# Patient Record
Sex: Female | Born: 1975 | Race: White | Hispanic: No | Marital: Married | Smoking: Never smoker
Health system: Southern US, Community
[De-identification: ages and names within clinical notes are randomized; demographics above are authoritative.]

## PROBLEM LIST (undated history)

## (undated) DIAGNOSIS — F419 Anxiety disorder, unspecified: Secondary | ICD-10-CM

## (undated) DIAGNOSIS — Z973 Presence of spectacles and contact lenses: Secondary | ICD-10-CM

## (undated) DIAGNOSIS — N840 Polyp of corpus uteri: Secondary | ICD-10-CM

## (undated) DIAGNOSIS — E785 Hyperlipidemia, unspecified: Secondary | ICD-10-CM

## (undated) DIAGNOSIS — K219 Gastro-esophageal reflux disease without esophagitis: Secondary | ICD-10-CM

## (undated) DIAGNOSIS — M1990 Unspecified osteoarthritis, unspecified site: Secondary | ICD-10-CM

## (undated) HISTORY — DX: Anxiety disorder, unspecified: F41.9

## (undated) HISTORY — DX: Gastro-esophageal reflux disease without esophagitis: K21.9

## (undated) HISTORY — PX: SHOULDER SURGERY: SHX246

## (undated) HISTORY — DX: Hyperlipidemia, unspecified: E78.5

---
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
## 2008-08-02 HISTORY — PX: OTHER SURGICAL HISTORY: SHX169

## 2010-08-02 HISTORY — PX: OTHER SURGICAL HISTORY: SHX169

## 2012-05-25 ENCOUNTER — Encounter: Payer: Self-pay | Admitting: Sports Medicine

## 2012-05-25 ENCOUNTER — Ambulatory Visit (INDEPENDENT_AMBULATORY_CARE_PROVIDER_SITE_OTHER): Payer: BC Managed Care – PPO | Admitting: Sports Medicine

## 2012-05-25 VITALS — BP 130/82 | HR 69 | Ht 63.5 in | Wt 120.0 lb

## 2012-05-25 DIAGNOSIS — M25561 Pain in right knee: Secondary | ICD-10-CM

## 2012-05-25 DIAGNOSIS — M25569 Pain in unspecified knee: Secondary | ICD-10-CM

## 2012-05-25 DIAGNOSIS — M25562 Pain in left knee: Secondary | ICD-10-CM

## 2012-05-25 NOTE — Assessment & Plan Note (Signed)
Ultrasound of right and left knee Quadriceps tendon is normal bilaterally Patellar tendon is normal bilaterally there is a very small spur at the left distal patellar pole Retropatellar sulcus is reviewed and a sunrise position and there is no abnormal spurring or loss of cartilage on either side Medial meniscus is normal bilaterally Lateral meniscus is normal bilaterally Suprapatellar pouch does not reveal any effusion  With relatively normal ultrasound and unremarkable exam she could consider returning to running. Her running gait is excellent.  I think we should do the following Resume her rehabilitation exercises for her hips and knees as prescribed physical therapy Use body helix patellar straps bilaterally She was placed in sports insoles today She can continue with elliptical and spin class Start doing some easy treadmill running If this goes okay and she was to get back into running I would suggest we put her in a custom orthotic to cushion the knees bilaterally  She will return to see me in 4-6 weeks if she decides to do this

## 2012-05-25 NOTE — Progress Notes (Signed)
Patient ID: Roberta Larson, female   DOB: Jul 12, 1976, 36 y.o.   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 She became a runner and very active individual throughout her pediatrics training and while she was living in [place]. Unfortunately she developed anterior knee pain. 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  She saw physical therapy. She stopped running. She has been doing elliptical and spin class without much knee pain. When she has tried to start running again she has had recurrence of knee pain.  Now over the last few weeks she said pain in the anterior portion of the left knee. She does not see any swelling locking or giving way. She would like to know if any issues before the left knee becomes more of a problem like the right.  She was put in rigid half length orthotics for shin splints when she was in middle school  Physical examination  No acute distress  Bialteral Knee: Normal to inspection with no erythema or effusion or obvious bony abnormalities. Palpation normal with no warmth or joint line tenderness No patellar tenderness or condyle tenderness. ROM normal in flexion and extension and lower leg rotation. Left knee shows about 3 hyperextension the right is at 0  Ligaments with solid consistent endpoints including ACL, PCL, LCL, MCL. Negative Mcmurray's and provocative meniscal tests.  Non painful patellar tracking but the  Patella does move laterally on both the right and left more so on the right  There is some mild crepitation at the upper outer corner of both patellas  Patellar and quadriceps tendons unremarkable. Hamstring and quadriceps strength is normal. She actually has excellent quadriceps development. Hip abduction strength is excellent  Feet show loss of longitudinal arch

## 2012-08-02 HISTORY — PX: ABDOMINOPLASTY: SUR9

## 2012-10-27 ENCOUNTER — Other Ambulatory Visit: Payer: Self-pay | Admitting: *Deleted

## 2012-10-27 DIAGNOSIS — Z1231 Encounter for screening mammogram for malignant neoplasm of breast: Secondary | ICD-10-CM

## 2012-11-15 ENCOUNTER — Ambulatory Visit
Admission: RE | Admit: 2012-11-15 | Discharge: 2012-11-15 | Disposition: A | Discharge status: Home / Self Care | Payer: BC Managed Care – PPO | Source: Ambulatory Visit | Attending: *Deleted | Admitting: *Deleted

## 2012-11-15 DIAGNOSIS — Z1231 Encounter for screening mammogram for malignant neoplasm of breast: Secondary | ICD-10-CM

## 2014-10-22 ENCOUNTER — Encounter (HOSPITAL_COMMUNITY): Payer: Self-pay | Admitting: Emergency Medicine

## 2014-10-22 ENCOUNTER — Emergency Department (HOSPITAL_COMMUNITY)
Admission: EM | Admit: 2014-10-22 | Discharge: 2014-10-22 | Disposition: A | Discharge status: Home / Self Care | Payer: BLUE CROSS/BLUE SHIELD | Source: Home / Self Care

## 2014-10-22 DIAGNOSIS — S61219A Laceration without foreign body of unspecified finger without damage to nail, initial encounter: Secondary | ICD-10-CM

## 2014-10-22 NOTE — ED Provider Notes (Signed)
Roberta Larson is a 39 y.o. female who presents to Urgent Care today for laceration. Patient suffered a laceration to her right third digit at the radial aspect today at home. She cut her finger while cutting vegetables with a sharp clean knife. She applied compression which control bleeding. Her last tetanus vaccine was approximately 4 years ago. She denies any weakness or numbness distally.   History reviewed. No pertinent past medical history. History reviewed. No pertinent past surgical history. History  Substance Use Topics  . Smoking status: Never Smoker   . Smokeless tobacco: Never Used  . Alcohol Use: Not on file   ROS as above Medications: No current facility-administered medications for this encounter.   Current Outpatient Prescriptions  Medication Sig Dispense Refill  . Cetirizine HCl (ZYRTEC ALLERGY) 10 MG CAPS Take 10 mg by mouth daily.    . Multiple Vitamins-Minerals (MULTIVITAMIN WITH MINERALS) tablet Take 1 tablet by mouth daily.    Marland Kitchen. NAPROXEN PO Take by mouth as needed.     No Known Allergies   Exam:  BP 125/83 mmHg  Pulse 64  Temp(Src) 98.9 F (37.2 C) (Oral)  Resp 18  SpO2 100% Gen: Well NAD Right third digit V-shaped shallow laceration at the radial aspect of the distal phalanx.  Motion capillary refill sensation are intact distally  Laceration repair:  Consent obtained and timeout performed. Tourniquet applied. Dermabond applied and allowed a harden Tourniquet removed. Total tourniquet time less than 5 minutes.  No results found for this or any previous visit (from the past 24 hour(s)). No results found.  Assessment and Plan: 39 y.o. female with distal laceration to the third finger. Repaired with Dermabond. Tetanus up-to-date.  Discussed warning signs or symptoms. Please see discharge instructions. Patient expresses understanding.     Rodolph BongEvan S Breydon Senters, MD 10/22/14 [phone]

## 2014-10-22 NOTE — ED Notes (Signed)
Cut finger tip while cooking this evening-right middle fingertip

## 2014-10-22 NOTE — Discharge Instructions (Signed)
Thank you for coming in today. ° °Laceration Care, Adult °A laceration is a cut or lesion that goes through all layers of the skin and into the tissue just beneath the skin. °TREATMENT  °Some lacerations may not require closure. Some lacerations may not be able to be closed due to an increased risk of infection. It is important to see your caregiver as soon as possible after an injury to minimize the risk of infection and maximize the opportunity for successful closure. °If closure is appropriate, pain medicines may be given, if needed. The wound will be cleaned to help prevent infection. Your caregiver will use stitches (sutures), staples, wound glue (adhesive), or skin adhesive strips to repair the laceration. These tools bring the skin edges together to allow for faster healing and a better cosmetic outcome. However, all wounds will heal with a scar. Once the wound has healed, scarring can be minimized by covering the wound with sunscreen during the day for 1 full year. °HOME CARE INSTRUCTIONS  °For sutures or staples: °· Keep the wound clean and dry. °· If you were given a bandage (dressing), you should change it at least once a day. Also, change the dressing if it becomes wet or dirty, or as directed by your caregiver. °· Wash the wound with soap and water 2 times a day. Rinse the wound off with water to remove all soap. Pat the wound dry with a clean towel. °· After cleaning, apply a thin layer of the antibiotic ointment as recommended by your caregiver. This will help prevent infection and keep the dressing from sticking. °· You may shower as usual after the first 24 hours. Do not soak the wound in water until the sutures are removed. °· Only take over-the-counter or prescription medicines for pain, discomfort, or fever as directed by your caregiver. °· Get your sutures or staples removed as directed by your caregiver. °For skin adhesive strips: °· Keep the wound clean and dry. °· Do not get the skin adhesive  strips wet. You may bathe carefully, using caution to keep the wound dry. °· If the wound gets wet, pat it dry with a clean towel. °· Skin adhesive strips will fall off on their own. You may trim the strips as the wound heals. Do not remove skin adhesive strips that are still stuck to the wound. They will fall off in time. °For wound adhesive: °· You may briefly wet your wound in the shower or bath. Do not soak or scrub the wound. Do not swim. Avoid periods of heavy perspiration until the skin adhesive has fallen off on its own. After showering or bathing, gently pat the wound dry with a clean towel. °· Do not apply liquid medicine, cream medicine, or ointment medicine to your wound while the skin adhesive is in place. This may loosen the film before your wound is healed. °· If a dressing is placed over the wound, be careful not to apply tape directly over the skin adhesive. This may cause the adhesive to be pulled off before the wound is healed. °· Avoid prolonged exposure to sunlight or tanning lamps while the skin adhesive is in place. Exposure to ultraviolet light in the first year will darken the scar. °· The skin adhesive will usually remain in place for 5 to 10 days, then naturally fall off the skin. Do not pick at the adhesive film. °You may need a tetanus shot if: °· You cannot remember when you had your last tetanus shot. °·   You have never had a tetanus shot. °If you get a tetanus shot, your arm may swell, get red, and feel warm to the touch. This is common and not a problem. If you need a tetanus shot and you choose not to have one, there is a rare chance of getting tetanus. Sickness from tetanus can be serious. °SEEK MEDICAL CARE IF:  °· You have redness, swelling, or increasing pain in the wound. °· You see a red line that goes away from the wound. °· You have yellowish-white fluid (pus) coming from the wound. °· You have a fever. °· You notice a bad smell coming from the wound or dressing. °· Your  wound breaks open before or after sutures have been removed. °· You notice something coming out of the wound such as wood or glass. °· Your wound is on your hand or foot and you cannot move a finger or toe. °SEEK IMMEDIATE MEDICAL CARE IF:  °· Your pain is not controlled with prescribed medicine. °· You have severe swelling around the wound causing pain and numbness or a change in color in your arm, hand, leg, or foot. °· Your wound splits open and starts bleeding. °· You have worsening numbness, weakness, or loss of function of any joint around or beyond the wound. °· You develop painful lumps near the wound or on the skin anywhere on your body. °MAKE SURE YOU:  °· Understand these instructions. °· Will watch your condition. °· Will get help right away if you are not doing well or get worse. °Document Released: 07/19/2005 Document Revised: 10/11/2011 Document Reviewed: 01/12/2011 °ExitCare® Patient Information ©2015 [employer]. This information is not intended to replace advice given to you by your health care provider. Make sure you discuss any questions you have with your health care provider. ° °

## 2015-07-16 ENCOUNTER — Other Ambulatory Visit: Payer: Self-pay | Admitting: Orthopedic Surgery

## 2015-07-16 DIAGNOSIS — M79652 Pain in left thigh: Secondary | ICD-10-CM

## 2015-07-16 DIAGNOSIS — M25552 Pain in left hip: Secondary | ICD-10-CM

## 2015-08-01 ENCOUNTER — Ambulatory Visit
Admission: RE | Admit: 2015-08-01 | Discharge: 2015-08-01 | Disposition: A | Discharge status: Home / Self Care | Payer: 59 | Source: Ambulatory Visit | Attending: Orthopedic Surgery | Admitting: Orthopedic Surgery

## 2015-08-01 DIAGNOSIS — M25552 Pain in left hip: Secondary | ICD-10-CM

## 2015-08-01 DIAGNOSIS — M79652 Pain in left thigh: Secondary | ICD-10-CM

## 2015-08-01 MED ORDER — IOHEXOL 180 MG/ML  SOLN
15.0000 mL | Freq: Once | INTRAMUSCULAR | Status: AC | PRN
Start: 2015-08-01 — End: 2015-08-01
  Administered 2015-08-01: 15 mL via INTRA_ARTICULAR

## 2015-09-03 HISTORY — PX: HIP ARTHROSCOPY W/ LABRAL REPAIR: SHX1750

## 2016-09-30 ENCOUNTER — Encounter (HOSPITAL_BASED_OUTPATIENT_CLINIC_OR_DEPARTMENT_OTHER): Payer: Self-pay | Admitting: *Deleted

## 2016-09-30 NOTE — Progress Notes (Signed)
NPO AFTER MN W/ EXCEPTION CLEAR LIQUIDS UNTIL 0700 ( NO CREAM/  MILK PRODUCTS).  ARRIVE AT 1130. NEEDS URINE PREG.  GETTING LAB WORK DONE Wednesday, 10-06-2016 (CBC, BMET).

## 2016-10-05 NOTE — H&P (Addendum)
Roberta MarseilleCarey Larson is an 41 y.o. female with intermenstrual bleeding.  Pelvic ultrasound showed possibly 3 endometrial polyps.  Pertinent Gynecological History: Menses: flow is moderate Bleeding: intermenstrual bleeding Contraception: vasectomy DES exposure: unknown Blood transfusions: none Sexually transmitted diseases: no past history Previous GYN Procedures: C/S, ex lap with RSO  Last mammogram: n/a Date: n/a Last pap: normal Date: 08/2016 OB History: G2, P2   Menstrual History: Menarche age: n/a Patient's last menstrual period was 09/03/2016 (exact date).    Past Medical History:  Diagnosis Date  . Endometrial polyp   . OA (osteoarthritis)    RIGHT KNEE,  LEFT HIP  . Wears contact lenses     Past Surgical History:  Procedure Laterality Date  . ABDOMINOPLASTY  2014  . BREAST ENHANCEMENT SURGERY  1997  . CESAREAN SECTION  2008  . EXCISION SUBCUTANEOUS CYST  2012  . EXPLORATORY LAPARTOMY W/ RIGHT SALPINGOOPHORECTOMY  2010  . HIP ARTHROSCOPY W/ LABRAL REPAIR Left 09/2015    History reviewed. No pertinent family history.  Social History:  reports that she has never smoked. She has never used smokeless tobacco. She reports that she drinks alcohol. She reports that she does not use drugs.  Allergies: No Known Allergies  No prescriptions prior to admission.    ROS  Height 5' 3.5" (1.613 m), weight 54.4 kg (120 lb), last menstrual period 09/03/2016. Physical Exam  Constitutional: She is oriented to person, place, and time. She appears well-developed and well-nourished.  GI: Soft. There is no rebound and no guarding.  Neurological: She is alert and oriented to person, place, and time.  Skin: Skin is warm and dry.  Psychiatric: She has a normal mood and affect. Her behavior is normal.    No results found for this or any previous visit (from the past 24 hour(s)).  No results found.  Assessment/Plan: 40yo with endometrial polyps -H/S, D&C, Myosure -Patient has been  counseled re: risk of bleeding, infection, scarring and damage to surrounding structures.  All questions were answered and the patient wishes to proceed.  Roma Bondar 10/05/2016, 8:58 AM

## 2016-10-06 DIAGNOSIS — N926 Irregular menstruation, unspecified: Secondary | ICD-10-CM | POA: Diagnosis present

## 2016-10-06 DIAGNOSIS — M1711 Unilateral primary osteoarthritis, right knee: Secondary | ICD-10-CM | POA: Diagnosis not present

## 2016-10-06 DIAGNOSIS — Z96642 Presence of left artificial hip joint: Secondary | ICD-10-CM | POA: Diagnosis not present

## 2016-10-06 DIAGNOSIS — R938 Abnormal findings on diagnostic imaging of other specified body structures: Secondary | ICD-10-CM | POA: Diagnosis present

## 2016-10-06 DIAGNOSIS — N8500 Endometrial hyperplasia, unspecified: Secondary | ICD-10-CM | POA: Diagnosis not present

## 2016-10-06 DIAGNOSIS — Z9889 Other specified postprocedural states: Secondary | ICD-10-CM | POA: Diagnosis not present

## 2016-10-06 DIAGNOSIS — Z90721 Acquired absence of ovaries, unilateral: Secondary | ICD-10-CM | POA: Diagnosis not present

## 2016-10-06 LAB — CBC
HCT: 39.6 % (ref 36.0–46.0)
Hemoglobin: 13.2 g/dL (ref 12.0–15.0)
MCH: 30.8 pg (ref 26.0–34.0)
MCHC: 33.3 g/dL (ref 30.0–36.0)
MCV: 92.3 fL (ref 78.0–100.0)
PLATELETS: 175 10*3/uL (ref 150–400)
RBC: 4.29 MIL/uL (ref 3.87–5.11)
RDW: 12.9 % (ref 11.5–15.5)
WBC: 3.3 10*3/uL — AB (ref 4.0–10.5)

## 2016-10-06 LAB — BASIC METABOLIC PANEL
ANION GAP: 6 (ref 5–15)
BUN: 18 mg/dL (ref 6–20)
CO2: 29 mmol/L (ref 22–32)
Calcium: 9.6 mg/dL (ref 8.9–10.3)
Chloride: 103 mmol/L (ref 101–111)
Creatinine, Ser: 0.74 mg/dL (ref 0.44–1.00)
Glucose, Bld: 91 mg/dL (ref 65–99)
Potassium: 4.2 mmol/L (ref 3.5–5.1)
Sodium: 138 mmol/L (ref 135–145)

## 2016-10-07 ENCOUNTER — Encounter (HOSPITAL_BASED_OUTPATIENT_CLINIC_OR_DEPARTMENT_OTHER)
Admission: RE | Disposition: A | Discharge status: Home / Self Care | Payer: Self-pay | Source: Ambulatory Visit | Attending: Obstetrics & Gynecology

## 2016-10-07 ENCOUNTER — Ambulatory Visit (HOSPITAL_BASED_OUTPATIENT_CLINIC_OR_DEPARTMENT_OTHER): Payer: 59 | Admitting: Anesthesiology

## 2016-10-07 ENCOUNTER — Encounter (HOSPITAL_BASED_OUTPATIENT_CLINIC_OR_DEPARTMENT_OTHER): Payer: Self-pay | Admitting: Anesthesiology

## 2016-10-07 ENCOUNTER — Ambulatory Visit (HOSPITAL_BASED_OUTPATIENT_CLINIC_OR_DEPARTMENT_OTHER)
Admission: RE | Admit: 2016-10-07 | Discharge: 2016-10-07 | Disposition: A | Discharge status: Home / Self Care | Payer: 59 | Source: Ambulatory Visit | Attending: Obstetrics & Gynecology | Admitting: Obstetrics & Gynecology

## 2016-10-07 DIAGNOSIS — N8500 Endometrial hyperplasia, unspecified: Secondary | ICD-10-CM | POA: Diagnosis not present

## 2016-10-07 DIAGNOSIS — Z90721 Acquired absence of ovaries, unilateral: Secondary | ICD-10-CM | POA: Insufficient documentation

## 2016-10-07 DIAGNOSIS — N840 Polyp of corpus uteri: Secondary | ICD-10-CM

## 2016-10-07 DIAGNOSIS — M1711 Unilateral primary osteoarthritis, right knee: Secondary | ICD-10-CM | POA: Insufficient documentation

## 2016-10-07 DIAGNOSIS — Z9889 Other specified postprocedural states: Secondary | ICD-10-CM | POA: Insufficient documentation

## 2016-10-07 DIAGNOSIS — Z96642 Presence of left artificial hip joint: Secondary | ICD-10-CM | POA: Insufficient documentation

## 2016-10-07 DIAGNOSIS — N926 Irregular menstruation, unspecified: Secondary | ICD-10-CM | POA: Insufficient documentation

## 2016-10-07 DIAGNOSIS — R938 Abnormal findings on diagnostic imaging of other specified body structures: Secondary | ICD-10-CM | POA: Insufficient documentation

## 2016-10-07 HISTORY — PX: DILATATION & CURETTAGE/HYSTEROSCOPY WITH MYOSURE: SHX6511

## 2016-10-07 HISTORY — DX: Polyp of corpus uteri: N84.0

## 2016-10-07 HISTORY — DX: Presence of spectacles and contact lenses: Z97.3

## 2016-10-07 HISTORY — DX: Unspecified osteoarthritis, unspecified site: M19.90

## 2016-10-07 LAB — POCT PREGNANCY, URINE: Preg Test, Ur: NEGATIVE

## 2016-10-07 SURGERY — DILATATION & CURETTAGE/HYSTEROSCOPY WITH MYOSURE
Anesthesia: General | Site: Uterus

## 2016-10-07 MED ORDER — ONDANSETRON HCL 4 MG/2ML IJ SOLN
INTRAMUSCULAR | Status: AC
  Filled 2016-10-07: qty 2

## 2016-10-07 MED ORDER — FENTANYL CITRATE (PF) 100 MCG/2ML IJ SOLN
INTRAMUSCULAR | Status: AC
  Filled 2016-10-07: qty 2

## 2016-10-07 MED ORDER — KETOROLAC TROMETHAMINE 30 MG/ML IJ SOLN
INTRAMUSCULAR | Status: AC
  Filled 2016-10-07: qty 1

## 2016-10-07 MED ORDER — FENTANYL CITRATE (PF) 100 MCG/2ML IJ SOLN
INTRAMUSCULAR | Status: DC | PRN
  Administered 2016-10-07 (×2): 50 ug via INTRAVENOUS

## 2016-10-07 MED ORDER — CEFAZOLIN SODIUM-DEXTROSE 2-4 GM/100ML-% IV SOLN
2.0000 g | INTRAVENOUS | Status: AC
  Administered 2016-10-07: 2 g via INTRAVENOUS
  Filled 2016-10-07: qty 100

## 2016-10-07 MED ORDER — ONDANSETRON HCL 4 MG/2ML IJ SOLN
INTRAMUSCULAR | Status: DC | PRN
  Administered 2016-10-07: 4 mg via INTRAVENOUS

## 2016-10-07 MED ORDER — LIDOCAINE HCL 1 % IJ SOLN
INTRAMUSCULAR | Status: DC | PRN
  Administered 2016-10-07: 10 mL

## 2016-10-07 MED ORDER — EPHEDRINE 5 MG/ML INJ
INTRAVENOUS | Status: AC
  Filled 2016-10-07: qty 10

## 2016-10-07 MED ORDER — DEXAMETHASONE SODIUM PHOSPHATE 4 MG/ML IJ SOLN
INTRAMUSCULAR | Status: DC | PRN
  Administered 2016-10-07: 10 mg via INTRAVENOUS

## 2016-10-07 MED ORDER — LACTATED RINGERS IV SOLN
INTRAVENOUS | Status: DC
  Administered 2016-10-07: 12:00:00 via INTRAVENOUS
  Filled 2016-10-07: qty 1000

## 2016-10-07 MED ORDER — LACTATED RINGERS IV SOLN
INTRAVENOUS | Status: DC
  Administered 2016-10-07 (×2): via INTRAVENOUS
  Filled 2016-10-07: qty 1000

## 2016-10-07 MED ORDER — PROPOFOL 10 MG/ML IV BOLUS
INTRAVENOUS | Status: AC
  Filled 2016-10-07: qty 20

## 2016-10-07 MED ORDER — IBUPROFEN 600 MG PO TABS: 600.0000 mg | ORAL_TABLET | Freq: Four times a day (QID) | ORAL | 0 refills | Status: DC | PRN

## 2016-10-07 MED ORDER — LIDOCAINE 2% (20 MG/ML) 5 ML SYRINGE
INTRAMUSCULAR | Status: AC
  Filled 2016-10-07: qty 5

## 2016-10-07 MED ORDER — MIDAZOLAM HCL 2 MG/2ML IJ SOLN
INTRAMUSCULAR | Status: AC
  Filled 2016-10-07: qty 2

## 2016-10-07 MED ORDER — HYDROMORPHONE HCL 1 MG/ML IJ SOLN
0.2500 mg | INTRAMUSCULAR | Status: DC | PRN
  Filled 2016-10-07: qty 0.5

## 2016-10-07 MED ORDER — CEFAZOLIN SODIUM-DEXTROSE 2-4 GM/100ML-% IV SOLN
INTRAVENOUS | Status: AC
  Filled 2016-10-07: qty 100

## 2016-10-07 MED ORDER — KETOROLAC TROMETHAMINE 30 MG/ML IJ SOLN
INTRAMUSCULAR | Status: DC | PRN
  Administered 2016-10-07: 30 mg via INTRAVENOUS

## 2016-10-07 MED ORDER — SODIUM CHLORIDE 0.9 % IR SOLN
Status: DC | PRN
  Administered 2016-10-07: 1

## 2016-10-07 MED ORDER — LIDOCAINE 2% (20 MG/ML) 5 ML SYRINGE
INTRAMUSCULAR | Status: DC | PRN
  Administered 2016-10-07: 80 mg via INTRAVENOUS

## 2016-10-07 MED ORDER — EPHEDRINE SULFATE-NACL 50-0.9 MG/10ML-% IV SOSY
PREFILLED_SYRINGE | INTRAVENOUS | Status: DC | PRN
  Administered 2016-10-07: 10 mg via INTRAVENOUS

## 2016-10-07 MED ORDER — OXYCODONE-ACETAMINOPHEN 5-325 MG PO TABS: 1.0000 | ORAL_TABLET | Freq: Four times a day (QID) | ORAL | 0 refills | Status: DC | PRN

## 2016-10-07 MED ORDER — DEXAMETHASONE SODIUM PHOSPHATE 10 MG/ML IJ SOLN
INTRAMUSCULAR | Status: AC
  Filled 2016-10-07: qty 1

## 2016-10-07 MED ORDER — MIDAZOLAM HCL 5 MG/5ML IJ SOLN
INTRAMUSCULAR | Status: DC | PRN
  Administered 2016-10-07: 2 mg via INTRAVENOUS

## 2016-10-07 MED ORDER — PROPOFOL 10 MG/ML IV BOLUS
INTRAVENOUS | Status: DC | PRN
  Administered 2016-10-07: 180 mg via INTRAVENOUS

## 2016-10-07 SURGICAL SUPPLY — 32 items
BIPOLAR CUTTING LOOP 21FR (ELECTRODE)
CANISTER SUCT 3000ML PPV (MISCELLANEOUS) ×3 IMPLANT
CATH ROBINSON RED A/P 16FR (CATHETERS) ×3 IMPLANT
COVER BACK TABLE 60X90IN (DRAPES) ×6 IMPLANT
DEVICE MYOSURE LITE (MISCELLANEOUS) IMPLANT
DEVICE MYOSURE REACH (MISCELLANEOUS) IMPLANT
DILATOR CANAL MILEX (MISCELLANEOUS) IMPLANT
DRAPE LG THREE QUARTER DISP (DRAPES) ×3 IMPLANT
DRSG TELFA 3X8 NADH (GAUZE/BANDAGES/DRESSINGS) ×3 IMPLANT
ELECT REM PT RETURN 9FT ADLT (ELECTROSURGICAL)
ELECTRODE REM PT RTRN 9FT ADLT (ELECTROSURGICAL) IMPLANT
FILTER ARTHROSCOPY CONVERTOR (FILTER) ×3 IMPLANT
GLOVE BIO SURGEON STRL SZ 6 (GLOVE) ×6 IMPLANT
GLOVE BIOGEL PI IND STRL 6 (GLOVE) ×1 IMPLANT
GLOVE BIOGEL PI INDICATOR 6 (GLOVE) ×2
GLOVE SURG SS PI 6.0 STRL IVOR (GLOVE) ×6 IMPLANT
GOWN STRL REUS W/ TWL LRG LVL3 (GOWN DISPOSABLE) ×1 IMPLANT
GOWN STRL REUS W/TWL LRG LVL3 (GOWN DISPOSABLE) ×2
KIT RM TURNOVER CYSTO AR (KITS) ×3 IMPLANT
LEGGING LITHOTOMY PAIR STRL (DRAPES) ×3 IMPLANT
LOOP CUTTING BIPOLAR 21FR (ELECTRODE) IMPLANT
MYOSURE XL FIBROID REM (MISCELLANEOUS)
NEEDLE SPNL 22GX3.5 QUINCKE BK (NEEDLE) IMPLANT
PAD OB MATERNITY 4.3X12.25 (PERSONAL CARE ITEMS) ×3 IMPLANT
SEAL ROD LENS SCOPE MYOSURE (ABLATOR) ×3 IMPLANT
SYR CONTROL 10ML LL (SYRINGE) IMPLANT
SYSTEM TISS REMOVAL MYSR XL RM (MISCELLANEOUS) IMPLANT
TOWEL OR 17X24 6PK STRL BLUE (TOWEL DISPOSABLE) ×6 IMPLANT
TRAY DSU PREP LF (CUSTOM PROCEDURE TRAY) ×3 IMPLANT
TUBING AQUILEX INFLOW (TUBING) ×3 IMPLANT
TUBING AQUILEX OUTFLOW (TUBING) ×3 IMPLANT
WATER STERILE IRR 500ML POUR (IV SOLUTION) ×3 IMPLANT

## 2016-10-07 NOTE — Transfer of Care (Signed)
Last Vitals:  Vitals:   10/07/16 1129 10/07/16 1409  BP: 125/78 127/80  Pulse: 68 78  Resp: 16 12  Temp: 37 C 36.9 C    Last Pain:  Vitals:   10/07/16 1129  TempSrc: Oral      Patients Stated Pain Goal: 7 (10/07/16 1149)  Immediate Anesthesia Transfer of Care Note  Patient: Roberta Larson  Procedure(s) Performed: Procedure(s) (LRB): DILATATION & CURETTAGE/HYSTEROSCOPY (N/A)  Patient Location: PACU  Anesthesia Type: General  Level of Consciousness: awake, alert  and oriented  Airway & Oxygen Therapy: Patient Spontanous Breathing and Patient connected to nasal cannula oxygen  Post-op Assessment: Report given to PACU RN and Post -op Vital signs reviewed and stable  Post vital signs: Reviewed and stable  Complications: No apparent anesthesia complications

## 2016-10-07 NOTE — Op Note (Signed)
PREOPERATIVE DIAGNOSIS:  41 y.o. with irregular uterine bleeding  POSTOPERATIVE DIAGNOSIS: The same  PROCEDURE: Hysteroscopy, Dilation and Curettage.  SURGEON:  Dr. Linda Hedges  INDICATIONS: 41 y.o. here for scheduled surgery for heavy, irregular menstrual bleeding.   Risks of surgery were discussed with the patient including but not limited to: bleeding which may require transfusion; infection which may require antibiotics; injury to uterus or surrounding organs; intrauterine scarring which may impair future fertility; need for additional procedures including laparotomy or laparoscopy; and other postoperative/anesthesia complications. Written informed consent was obtained.    FINDINGS:  A 8 week size, retroflexed uterus.  Diffuse proliferative endometrium.  Normal ostia bilaterally.  ANESTHESIA:  MAC, paracervical block  ESTIMATED BLOOD LOSS:  Less than 20 ml  SPECIMENS: Endometrial curettings sent to pathology  COMPLICATIONS:  None immediate.  PROCEDURE DETAILS:  The patient received intravenous antibiotics while in the preoperative area.  She was then taken to the operating room where MAC was administered and was found to be adequate.  After an adequate timeout was performed, she was placed in the dorsal lithotomy position and examined; then prepped and draped in the sterile manner.   Her bladder was catheterized for an unmeasured amount of clear, yellow urine. A speculum was then placed in the patient's vagina and a single tooth tenaculum was applied to the anterior lip of the cervix.   A paracervical block using 10 ml of 1% Nesacaine was administered.  The uteus was sounded to 8.5 cm and dilated manually with metal dilators to accommodate the Myosure hysteroscope.  Once the cervix was dilated, the hysteroscope was inserted under direct visualization using saline as a suspension medium.  The uterine cavity was carefully examined, both ostia were recognized.   After further careful  visualization of the uterine cavity, the hysteroscope was removed under direct visualization.  A sharp curettage was then performed to obtain a large amount of endometrial curettings.  The tenaculum was removed from the anterior lip of the cervix and the vaginal speculum was removed after noting good hemostasis.  The patient tolerated the procedure well and was taken to the recovery area awake, extubated and in stable condition.

## 2016-10-07 NOTE — Discharge Instructions (Signed)
°  Post Anesthesia Home Care Instructions  Activity: Get plenty of rest for the remainder of the day. A responsible adult should stay with you for 24 hours following the procedure.  For the next 24 hours, DO NOT: -Drive a car -Advertising copywriterperate machinery -Drink alcoholic beverages -Take any medication unless instructed by your physician -Make any legal decisions or sign important papers.  Meals: Start with liquid foods such as gelatin or soup. Progress to regular foods as tolerated. Avoid greasy, spicy, heavy foods. If nausea and/or vomiting occur, drink only clear liquids until the nausea and/or vomiting subsides. Call your physician if vomiting continues.  Special Instructions/Symptoms: Your throat may feel dry or sore from the anesthesia or the breathing tube placed in your throat during surgery. If this causes discomfort, gargle with warm salt water. The discomfort should disappear within 24 hours.  If you had a scopolamine patch placed behind your ear for the management of post- operative nausea and/or vomiting:  1. The medication in the patch is effective for 72 hours, after which it should be removed.  Wrap patch in a tissue and discard in the trash. Wash hands thoroughly with soap and water. 2. You may remove the patch earlier than 72 hours if you experience unpleasant side effects which may include dry mouth, dizziness or visual disturbances. 3. Avoid touching the patch. Wash your hands with soap and water after contact with the patch.     D & C Home care Instructions:   Personal hygiene:  Used sanitary napkins for vaginal drainage not tampons. Shower or tub bathe the day after your procedure. No douching until bleeding stops. Always wipe from front to back after  Elimination.  Activity: Do not drive or operate any equipment today. The effects of the anesthesia are still present and drowsiness may result. Rest today, not necessarily flat bed rest, just take it easy. You may resume your  normal activity in one to 2 days.  Diet: Eat a light diet as desired this evening. You may resume a regular diet tomorrow.  Return to work: One to 2 days.  General Expectations of your surgery: Vaginal bleeding should be no heavier than a normal period. Spotting may continue up to 10 days. Mild cramps may continue for a couple of days. You may have a regular period in 2-6 weeks.  Unexpected observations call your doctor if these occur: persistent or heavy bleeding. Severe abdominal cramping or pain. Elevation of temperature greater than 100F.    Patient's Signature_______________________________________________________  Nurse's Signature________________________________________________________Call MD for T>100.4, heavy vaginal bleeding, severe abdominal pain, intractable nausea and/or vomiting, or respiratory distress.  Call office to schedule postop appointment in 2 weeks.  No driving while taking narcotics.  Pelvic rest x 2 weeks.

## 2016-10-07 NOTE — Anesthesia Procedure Notes (Addendum)
Procedure Name: LMA Insertion Date/Time: 10/07/2016 1:22 PM Performed by: Dorris SinghGREEN, CHARLENE Pre-anesthesia Checklist: Patient identified, Emergency Drugs available, Suction available and Patient being monitored Patient Re-evaluated:Patient Re-evaluated prior to inductionOxygen Delivery Method: Circle system utilized Preoxygenation: Pre-oxygenation with 100% oxygen Intubation Type: IV induction Ventilation: Mask ventilation without difficulty LMA: LMA inserted LMA Size: 4.0 Number of attempts: 1 Airway Equipment and Method: Bite block Placement Confirmation: positive ETCO2 Tube secured with: Tape Dental Injury: Teeth and Oropharynx as per pre-operative assessment

## 2016-10-07 NOTE — Anesthesia Postprocedure Evaluation (Signed)
Anesthesia Post Note  Patient: Roberta Larson  Procedure(s) Performed: Procedure(s) (LRB): DILATATION & CURETTAGE/HYSTEROSCOPY (N/A)  Patient location during evaluation: PACU Anesthesia Type: General Level of consciousness: awake Pain management: pain level controlled Vital Signs Assessment: post-procedure vital signs reviewed and stable Respiratory status: spontaneous breathing Cardiovascular status: stable Anesthetic complications: no       Last Vitals:  Vitals:   10/07/16 1409 10/07/16 1415  BP: 127/80 119/89  Pulse: 78 71  Resp: 12 (!) 22  Temp: 36.9 C     Last Pain:  Vitals:   10/07/16 1129  TempSrc: Oral                 Kadeja Granada

## 2016-10-07 NOTE — Progress Notes (Signed)
No change to H&P.  Informed consent signed.   Mitchel HonourMegan Madalene Mickler, DO

## 2016-10-07 NOTE — Anesthesia Preprocedure Evaluation (Addendum)
Anesthesia Evaluation  Patient identified by MRN, date of birth, ID band Patient awake    Reviewed: Allergy & Precautions, NPO status , Patient's Chart, lab work & pertinent test results  Airway Mallampati: II  TM Distance: >3 FB     Dental   Pulmonary neg pulmonary ROS,    breath sounds clear to auscultation       Cardiovascular negative cardio ROS   Rhythm:Regular Rate:Normal     Neuro/Psych    GI/Hepatic negative GI ROS, Neg liver ROS,   Endo/Other  negative endocrine ROS  Renal/GU negative Renal ROS     Musculoskeletal  (+) Arthritis ,   Abdominal   Peds  Hematology   Anesthesia Other Findings   Reproductive/Obstetrics                             Anesthesia Physical Anesthesia Plan  ASA: II  Anesthesia Plan: General   Post-op Pain Management:    Induction: Intravenous  Airway Management Planned: LMA  Additional Equipment:   Intra-op Plan:   Post-operative Plan: Extubation in OR  Informed Consent: I have reviewed the patients History and Physical, chart, labs and discussed the procedure including the risks, benefits and alternatives for the proposed anesthesia with the patient or authorized representative who has indicated his/her understanding and acceptance.   Dental advisory given  Plan Discussed with: CRNA and Anesthesiologist  Anesthesia Plan Comments:         Anesthesia Quick Evaluation

## 2016-10-08 ENCOUNTER — Encounter (HOSPITAL_BASED_OUTPATIENT_CLINIC_OR_DEPARTMENT_OTHER): Payer: Self-pay | Admitting: Obstetrics & Gynecology

## 2018-04-06 DIAGNOSIS — Z566 Other physical and mental strain related to work: Secondary | ICD-10-CM | POA: Diagnosis not present

## 2018-04-06 DIAGNOSIS — F419 Anxiety disorder, unspecified: Secondary | ICD-10-CM | POA: Diagnosis not present

## 2018-04-07 DIAGNOSIS — Z9889 Other specified postprocedural states: Secondary | ICD-10-CM | POA: Diagnosis not present

## 2018-04-07 DIAGNOSIS — G8929 Other chronic pain: Secondary | ICD-10-CM | POA: Diagnosis not present

## 2018-04-07 DIAGNOSIS — M25551 Pain in right hip: Secondary | ICD-10-CM | POA: Diagnosis not present

## 2018-04-11 ENCOUNTER — Other Ambulatory Visit: Payer: Self-pay | Admitting: Orthopedic Surgery

## 2018-04-11 DIAGNOSIS — M25551 Pain in right hip: Secondary | ICD-10-CM

## 2018-04-13 DIAGNOSIS — F419 Anxiety disorder, unspecified: Secondary | ICD-10-CM | POA: Diagnosis not present

## 2018-04-13 DIAGNOSIS — Z566 Other physical and mental strain related to work: Secondary | ICD-10-CM | POA: Diagnosis not present

## 2018-04-18 DIAGNOSIS — F419 Anxiety disorder, unspecified: Secondary | ICD-10-CM | POA: Diagnosis not present

## 2018-04-18 DIAGNOSIS — Z566 Other physical and mental strain related to work: Secondary | ICD-10-CM | POA: Diagnosis not present

## 2018-05-08 ENCOUNTER — Ambulatory Visit
Admission: RE | Admit: 2018-05-08 | Discharge: 2018-05-08 | Disposition: A | Discharge status: Home / Self Care | Payer: BLUE CROSS/BLUE SHIELD | Source: Ambulatory Visit | Attending: Orthopedic Surgery | Admitting: Orthopedic Surgery

## 2018-05-08 ENCOUNTER — Ambulatory Visit
Admission: RE | Admit: 2018-05-08 | Discharge: 2018-05-08 | Disposition: A | Discharge status: Home / Self Care | Payer: 59 | Source: Ambulatory Visit | Attending: Orthopedic Surgery | Admitting: Orthopedic Surgery

## 2018-05-08 DIAGNOSIS — Z566 Other physical and mental strain related to work: Secondary | ICD-10-CM | POA: Diagnosis not present

## 2018-05-08 DIAGNOSIS — M25551 Pain in right hip: Secondary | ICD-10-CM | POA: Diagnosis not present

## 2018-05-08 DIAGNOSIS — F419 Anxiety disorder, unspecified: Secondary | ICD-10-CM | POA: Diagnosis not present

## 2018-05-08 DIAGNOSIS — S43431A Superior glenoid labrum lesion of right shoulder, initial encounter: Secondary | ICD-10-CM | POA: Diagnosis not present

## 2018-05-08 MED ORDER — IOPAMIDOL (ISOVUE-M 200) INJECTION 41%
15.0000 mL | Freq: Once | INTRAMUSCULAR | Status: AC
  Administered 2018-05-08: 15 mL via INTRA_ARTICULAR

## 2018-05-18 DIAGNOSIS — F419 Anxiety disorder, unspecified: Secondary | ICD-10-CM | POA: Diagnosis not present

## 2018-05-18 DIAGNOSIS — Z566 Other physical and mental strain related to work: Secondary | ICD-10-CM | POA: Diagnosis not present

## 2018-05-25 DIAGNOSIS — F419 Anxiety disorder, unspecified: Secondary | ICD-10-CM | POA: Diagnosis not present

## 2018-05-25 DIAGNOSIS — Z566 Other physical and mental strain related to work: Secondary | ICD-10-CM | POA: Diagnosis not present

## 2018-05-30 DIAGNOSIS — Z9889 Other specified postprocedural states: Secondary | ICD-10-CM | POA: Diagnosis not present

## 2018-05-30 DIAGNOSIS — M25551 Pain in right hip: Secondary | ICD-10-CM | POA: Diagnosis not present

## 2018-06-01 DIAGNOSIS — L299 Pruritus, unspecified: Secondary | ICD-10-CM | POA: Diagnosis not present

## 2018-06-01 DIAGNOSIS — L739 Follicular disorder, unspecified: Secondary | ICD-10-CM | POA: Diagnosis not present

## 2018-06-05 DIAGNOSIS — F419 Anxiety disorder, unspecified: Secondary | ICD-10-CM | POA: Diagnosis not present

## 2018-06-05 DIAGNOSIS — Z566 Other physical and mental strain related to work: Secondary | ICD-10-CM | POA: Diagnosis not present

## 2018-06-22 DIAGNOSIS — Z566 Other physical and mental strain related to work: Secondary | ICD-10-CM | POA: Diagnosis not present

## 2018-06-22 DIAGNOSIS — F419 Anxiety disorder, unspecified: Secondary | ICD-10-CM | POA: Diagnosis not present

## 2018-06-23 DIAGNOSIS — E7800 Pure hypercholesterolemia, unspecified: Secondary | ICD-10-CM | POA: Diagnosis not present

## 2018-06-23 DIAGNOSIS — M169 Osteoarthritis of hip, unspecified: Secondary | ICD-10-CM | POA: Diagnosis not present

## 2018-06-23 DIAGNOSIS — Z01818 Encounter for other preprocedural examination: Secondary | ICD-10-CM | POA: Diagnosis not present

## 2018-06-27 DIAGNOSIS — R29898 Other symptoms and signs involving the musculoskeletal system: Secondary | ICD-10-CM | POA: Diagnosis not present

## 2018-06-27 DIAGNOSIS — M25651 Stiffness of right hip, not elsewhere classified: Secondary | ICD-10-CM | POA: Diagnosis not present

## 2018-06-27 DIAGNOSIS — M25551 Pain in right hip: Secondary | ICD-10-CM | POA: Diagnosis not present

## 2018-06-27 DIAGNOSIS — R262 Difficulty in walking, not elsewhere classified: Secondary | ICD-10-CM | POA: Diagnosis not present

## 2018-06-28 DIAGNOSIS — M25851 Other specified joint disorders, right hip: Secondary | ICD-10-CM | POA: Diagnosis not present

## 2018-06-28 DIAGNOSIS — M6588 Other synovitis and tenosynovitis, other site: Secondary | ICD-10-CM | POA: Diagnosis not present

## 2018-06-28 DIAGNOSIS — M76891 Other specified enthesopathies of right lower limb, excluding foot: Secondary | ICD-10-CM | POA: Diagnosis not present

## 2018-06-28 DIAGNOSIS — M25551 Pain in right hip: Secondary | ICD-10-CM | POA: Diagnosis not present

## 2018-06-28 DIAGNOSIS — S73191A Other sprain of right hip, initial encounter: Secondary | ICD-10-CM | POA: Diagnosis not present

## 2018-06-28 DIAGNOSIS — M94251 Chondromalacia, right hip: Secondary | ICD-10-CM | POA: Diagnosis not present

## 2018-06-28 DIAGNOSIS — M7060 Trochanteric bursitis, unspecified hip: Secondary | ICD-10-CM | POA: Diagnosis not present

## 2018-06-28 DIAGNOSIS — M24851 Other specific joint derangements of right hip, not elsewhere classified: Secondary | ICD-10-CM | POA: Diagnosis not present

## 2018-06-28 DIAGNOSIS — M65851 Other synovitis and tenosynovitis, right thigh: Secondary | ICD-10-CM | POA: Diagnosis not present

## 2018-06-28 DIAGNOSIS — X58XXXA Exposure to other specified factors, initial encounter: Secondary | ICD-10-CM | POA: Diagnosis not present

## 2018-06-28 DIAGNOSIS — Z9889 Other specified postprocedural states: Secondary | ICD-10-CM | POA: Diagnosis not present

## 2018-07-03 DIAGNOSIS — R29898 Other symptoms and signs involving the musculoskeletal system: Secondary | ICD-10-CM | POA: Diagnosis not present

## 2018-07-03 DIAGNOSIS — M25651 Stiffness of right hip, not elsewhere classified: Secondary | ICD-10-CM | POA: Diagnosis not present

## 2018-07-03 DIAGNOSIS — R262 Difficulty in walking, not elsewhere classified: Secondary | ICD-10-CM | POA: Diagnosis not present

## 2018-07-03 DIAGNOSIS — M25551 Pain in right hip: Secondary | ICD-10-CM | POA: Diagnosis not present

## 2018-07-05 DIAGNOSIS — R29898 Other symptoms and signs involving the musculoskeletal system: Secondary | ICD-10-CM | POA: Diagnosis not present

## 2018-07-05 DIAGNOSIS — R262 Difficulty in walking, not elsewhere classified: Secondary | ICD-10-CM | POA: Diagnosis not present

## 2018-07-05 DIAGNOSIS — M25551 Pain in right hip: Secondary | ICD-10-CM | POA: Diagnosis not present

## 2018-07-05 DIAGNOSIS — M25651 Stiffness of right hip, not elsewhere classified: Secondary | ICD-10-CM | POA: Diagnosis not present

## 2018-07-10 DIAGNOSIS — R29898 Other symptoms and signs involving the musculoskeletal system: Secondary | ICD-10-CM | POA: Diagnosis not present

## 2018-07-10 DIAGNOSIS — R262 Difficulty in walking, not elsewhere classified: Secondary | ICD-10-CM | POA: Diagnosis not present

## 2018-07-10 DIAGNOSIS — M25551 Pain in right hip: Secondary | ICD-10-CM | POA: Diagnosis not present

## 2018-07-10 DIAGNOSIS — M25651 Stiffness of right hip, not elsewhere classified: Secondary | ICD-10-CM | POA: Diagnosis not present

## 2018-07-13 DIAGNOSIS — R29898 Other symptoms and signs involving the musculoskeletal system: Secondary | ICD-10-CM | POA: Diagnosis not present

## 2018-07-13 DIAGNOSIS — M25551 Pain in right hip: Secondary | ICD-10-CM | POA: Diagnosis not present

## 2018-07-13 DIAGNOSIS — M25651 Stiffness of right hip, not elsewhere classified: Secondary | ICD-10-CM | POA: Diagnosis not present

## 2018-07-13 DIAGNOSIS — R262 Difficulty in walking, not elsewhere classified: Secondary | ICD-10-CM | POA: Diagnosis not present

## 2018-07-17 DIAGNOSIS — M25551 Pain in right hip: Secondary | ICD-10-CM | POA: Diagnosis not present

## 2018-07-17 DIAGNOSIS — M25651 Stiffness of right hip, not elsewhere classified: Secondary | ICD-10-CM | POA: Diagnosis not present

## 2018-07-17 DIAGNOSIS — R29898 Other symptoms and signs involving the musculoskeletal system: Secondary | ICD-10-CM | POA: Diagnosis not present

## 2018-07-17 DIAGNOSIS — R262 Difficulty in walking, not elsewhere classified: Secondary | ICD-10-CM | POA: Diagnosis not present

## 2018-07-20 DIAGNOSIS — M25551 Pain in right hip: Secondary | ICD-10-CM | POA: Diagnosis not present

## 2018-07-20 DIAGNOSIS — M25651 Stiffness of right hip, not elsewhere classified: Secondary | ICD-10-CM | POA: Diagnosis not present

## 2018-07-20 DIAGNOSIS — R262 Difficulty in walking, not elsewhere classified: Secondary | ICD-10-CM | POA: Diagnosis not present

## 2018-07-20 DIAGNOSIS — R29898 Other symptoms and signs involving the musculoskeletal system: Secondary | ICD-10-CM | POA: Diagnosis not present

## 2018-07-24 DIAGNOSIS — M25651 Stiffness of right hip, not elsewhere classified: Secondary | ICD-10-CM | POA: Diagnosis not present

## 2018-07-24 DIAGNOSIS — R29898 Other symptoms and signs involving the musculoskeletal system: Secondary | ICD-10-CM | POA: Diagnosis not present

## 2018-07-24 DIAGNOSIS — M25551 Pain in right hip: Secondary | ICD-10-CM | POA: Diagnosis not present

## 2018-07-24 DIAGNOSIS — R262 Difficulty in walking, not elsewhere classified: Secondary | ICD-10-CM | POA: Diagnosis not present

## 2018-07-27 DIAGNOSIS — R262 Difficulty in walking, not elsewhere classified: Secondary | ICD-10-CM | POA: Diagnosis not present

## 2018-07-27 DIAGNOSIS — R29898 Other symptoms and signs involving the musculoskeletal system: Secondary | ICD-10-CM | POA: Diagnosis not present

## 2018-07-27 DIAGNOSIS — M25551 Pain in right hip: Secondary | ICD-10-CM | POA: Diagnosis not present

## 2018-07-27 DIAGNOSIS — M25651 Stiffness of right hip, not elsewhere classified: Secondary | ICD-10-CM | POA: Diagnosis not present

## 2018-07-31 DIAGNOSIS — M25551 Pain in right hip: Secondary | ICD-10-CM | POA: Diagnosis not present

## 2018-07-31 DIAGNOSIS — R29898 Other symptoms and signs involving the musculoskeletal system: Secondary | ICD-10-CM | POA: Diagnosis not present

## 2018-07-31 DIAGNOSIS — R262 Difficulty in walking, not elsewhere classified: Secondary | ICD-10-CM | POA: Diagnosis not present

## 2018-07-31 DIAGNOSIS — M25651 Stiffness of right hip, not elsewhere classified: Secondary | ICD-10-CM | POA: Diagnosis not present

## 2018-08-03 DIAGNOSIS — M25551 Pain in right hip: Secondary | ICD-10-CM | POA: Diagnosis not present

## 2018-08-03 DIAGNOSIS — M25651 Stiffness of right hip, not elsewhere classified: Secondary | ICD-10-CM | POA: Diagnosis not present

## 2018-08-03 DIAGNOSIS — R29898 Other symptoms and signs involving the musculoskeletal system: Secondary | ICD-10-CM | POA: Diagnosis not present

## 2018-08-03 DIAGNOSIS — R262 Difficulty in walking, not elsewhere classified: Secondary | ICD-10-CM | POA: Diagnosis not present

## 2018-08-07 DIAGNOSIS — M25551 Pain in right hip: Secondary | ICD-10-CM | POA: Diagnosis not present

## 2018-08-07 DIAGNOSIS — Z9889 Other specified postprocedural states: Secondary | ICD-10-CM | POA: Diagnosis not present

## 2018-08-08 DIAGNOSIS — R29898 Other symptoms and signs involving the musculoskeletal system: Secondary | ICD-10-CM | POA: Diagnosis not present

## 2018-08-08 DIAGNOSIS — R262 Difficulty in walking, not elsewhere classified: Secondary | ICD-10-CM | POA: Diagnosis not present

## 2018-08-08 DIAGNOSIS — M25651 Stiffness of right hip, not elsewhere classified: Secondary | ICD-10-CM | POA: Diagnosis not present

## 2018-08-08 DIAGNOSIS — M25551 Pain in right hip: Secondary | ICD-10-CM | POA: Diagnosis not present

## 2018-08-10 DIAGNOSIS — R29898 Other symptoms and signs involving the musculoskeletal system: Secondary | ICD-10-CM | POA: Diagnosis not present

## 2018-08-10 DIAGNOSIS — R262 Difficulty in walking, not elsewhere classified: Secondary | ICD-10-CM | POA: Diagnosis not present

## 2018-08-10 DIAGNOSIS — M25651 Stiffness of right hip, not elsewhere classified: Secondary | ICD-10-CM | POA: Diagnosis not present

## 2018-08-10 DIAGNOSIS — Z7409 Other reduced mobility: Secondary | ICD-10-CM | POA: Diagnosis not present

## 2018-08-15 DIAGNOSIS — F419 Anxiety disorder, unspecified: Secondary | ICD-10-CM | POA: Diagnosis not present

## 2018-08-15 DIAGNOSIS — Z566 Other physical and mental strain related to work: Secondary | ICD-10-CM | POA: Diagnosis not present

## 2018-08-16 DIAGNOSIS — Z7409 Other reduced mobility: Secondary | ICD-10-CM | POA: Diagnosis not present

## 2018-08-16 DIAGNOSIS — Z9889 Other specified postprocedural states: Secondary | ICD-10-CM | POA: Diagnosis not present

## 2018-08-16 DIAGNOSIS — R262 Difficulty in walking, not elsewhere classified: Secondary | ICD-10-CM | POA: Diagnosis not present

## 2018-08-16 DIAGNOSIS — M25651 Stiffness of right hip, not elsewhere classified: Secondary | ICD-10-CM | POA: Diagnosis not present

## 2018-08-24 DIAGNOSIS — R262 Difficulty in walking, not elsewhere classified: Secondary | ICD-10-CM | POA: Diagnosis not present

## 2018-08-24 DIAGNOSIS — M25651 Stiffness of right hip, not elsewhere classified: Secondary | ICD-10-CM | POA: Diagnosis not present

## 2018-08-24 DIAGNOSIS — S73191S Other sprain of right hip, sequela: Secondary | ICD-10-CM | POA: Diagnosis not present

## 2018-08-24 DIAGNOSIS — Z7409 Other reduced mobility: Secondary | ICD-10-CM | POA: Diagnosis not present

## 2018-08-28 DIAGNOSIS — F419 Anxiety disorder, unspecified: Secondary | ICD-10-CM | POA: Diagnosis not present

## 2018-08-28 DIAGNOSIS — Z566 Other physical and mental strain related to work: Secondary | ICD-10-CM | POA: Diagnosis not present

## 2018-08-29 DIAGNOSIS — S73191S Other sprain of right hip, sequela: Secondary | ICD-10-CM | POA: Diagnosis not present

## 2018-08-29 DIAGNOSIS — Z7409 Other reduced mobility: Secondary | ICD-10-CM | POA: Diagnosis not present

## 2018-08-29 DIAGNOSIS — M25651 Stiffness of right hip, not elsewhere classified: Secondary | ICD-10-CM | POA: Diagnosis not present

## 2018-08-29 DIAGNOSIS — R262 Difficulty in walking, not elsewhere classified: Secondary | ICD-10-CM | POA: Diagnosis not present

## 2018-09-08 DIAGNOSIS — F419 Anxiety disorder, unspecified: Secondary | ICD-10-CM | POA: Diagnosis not present

## 2018-09-08 DIAGNOSIS — Z566 Other physical and mental strain related to work: Secondary | ICD-10-CM | POA: Diagnosis not present

## 2018-09-19 DIAGNOSIS — S73191S Other sprain of right hip, sequela: Secondary | ICD-10-CM | POA: Diagnosis not present

## 2018-09-19 DIAGNOSIS — R262 Difficulty in walking, not elsewhere classified: Secondary | ICD-10-CM | POA: Diagnosis not present

## 2018-09-19 DIAGNOSIS — Z7409 Other reduced mobility: Secondary | ICD-10-CM | POA: Diagnosis not present

## 2018-09-19 DIAGNOSIS — M25651 Stiffness of right hip, not elsewhere classified: Secondary | ICD-10-CM | POA: Diagnosis not present

## 2018-09-25 DIAGNOSIS — Z7409 Other reduced mobility: Secondary | ICD-10-CM | POA: Diagnosis not present

## 2018-09-25 DIAGNOSIS — R29898 Other symptoms and signs involving the musculoskeletal system: Secondary | ICD-10-CM | POA: Diagnosis not present

## 2018-09-25 DIAGNOSIS — Z9889 Other specified postprocedural states: Secondary | ICD-10-CM | POA: Diagnosis not present

## 2018-09-25 DIAGNOSIS — M25651 Stiffness of right hip, not elsewhere classified: Secondary | ICD-10-CM | POA: Diagnosis not present

## 2018-10-04 DIAGNOSIS — R29898 Other symptoms and signs involving the musculoskeletal system: Secondary | ICD-10-CM | POA: Diagnosis not present

## 2018-10-04 DIAGNOSIS — Z9889 Other specified postprocedural states: Secondary | ICD-10-CM | POA: Diagnosis not present

## 2018-10-04 DIAGNOSIS — S73191S Other sprain of right hip, sequela: Secondary | ICD-10-CM | POA: Diagnosis not present

## 2018-10-04 DIAGNOSIS — Z7409 Other reduced mobility: Secondary | ICD-10-CM | POA: Diagnosis not present

## 2018-10-09 DIAGNOSIS — Z9889 Other specified postprocedural states: Secondary | ICD-10-CM | POA: Diagnosis not present

## 2018-10-09 DIAGNOSIS — Z7409 Other reduced mobility: Secondary | ICD-10-CM | POA: Diagnosis not present

## 2018-10-09 DIAGNOSIS — M25651 Stiffness of right hip, not elsewhere classified: Secondary | ICD-10-CM | POA: Diagnosis not present

## 2018-10-09 DIAGNOSIS — R29898 Other symptoms and signs involving the musculoskeletal system: Secondary | ICD-10-CM | POA: Diagnosis not present

## 2018-12-22 DIAGNOSIS — Z566 Other physical and mental strain related to work: Secondary | ICD-10-CM | POA: Diagnosis not present

## 2018-12-22 DIAGNOSIS — F419 Anxiety disorder, unspecified: Secondary | ICD-10-CM | POA: Diagnosis not present

## 2018-12-26 DIAGNOSIS — Z6823 Body mass index (BMI) 23.0-23.9, adult: Secondary | ICD-10-CM | POA: Diagnosis not present

## 2018-12-26 DIAGNOSIS — Z01419 Encounter for gynecological examination (general) (routine) without abnormal findings: Secondary | ICD-10-CM | POA: Diagnosis not present

## 2019-01-11 DIAGNOSIS — D225 Melanocytic nevi of trunk: Secondary | ICD-10-CM | POA: Diagnosis not present

## 2019-01-11 DIAGNOSIS — D224 Melanocytic nevi of scalp and neck: Secondary | ICD-10-CM | POA: Diagnosis not present

## 2019-01-11 DIAGNOSIS — D2261 Melanocytic nevi of right upper limb, including shoulder: Secondary | ICD-10-CM | POA: Diagnosis not present

## 2019-01-11 DIAGNOSIS — D2262 Melanocytic nevi of left upper limb, including shoulder: Secondary | ICD-10-CM | POA: Diagnosis not present

## 2019-01-25 DIAGNOSIS — Z1231 Encounter for screening mammogram for malignant neoplasm of breast: Secondary | ICD-10-CM | POA: Diagnosis not present

## 2019-01-30 DIAGNOSIS — F419 Anxiety disorder, unspecified: Secondary | ICD-10-CM | POA: Diagnosis not present

## 2019-01-30 DIAGNOSIS — Z566 Other physical and mental strain related to work: Secondary | ICD-10-CM | POA: Diagnosis not present

## 2019-03-07 DIAGNOSIS — Z566 Other physical and mental strain related to work: Secondary | ICD-10-CM | POA: Diagnosis not present

## 2019-03-07 DIAGNOSIS — F419 Anxiety disorder, unspecified: Secondary | ICD-10-CM | POA: Diagnosis not present

## 2019-03-29 DIAGNOSIS — Z566 Other physical and mental strain related to work: Secondary | ICD-10-CM | POA: Diagnosis not present

## 2019-03-29 DIAGNOSIS — F419 Anxiety disorder, unspecified: Secondary | ICD-10-CM | POA: Diagnosis not present

## 2019-04-26 DIAGNOSIS — Z566 Other physical and mental strain related to work: Secondary | ICD-10-CM | POA: Diagnosis not present

## 2019-04-26 DIAGNOSIS — F419 Anxiety disorder, unspecified: Secondary | ICD-10-CM | POA: Diagnosis not present

## 2019-05-08 DIAGNOSIS — Z566 Other physical and mental strain related to work: Secondary | ICD-10-CM | POA: Diagnosis not present

## 2019-05-08 DIAGNOSIS — F419 Anxiety disorder, unspecified: Secondary | ICD-10-CM | POA: Diagnosis not present

## 2019-05-23 DIAGNOSIS — F419 Anxiety disorder, unspecified: Secondary | ICD-10-CM | POA: Diagnosis not present

## 2019-05-23 DIAGNOSIS — Z566 Other physical and mental strain related to work: Secondary | ICD-10-CM | POA: Diagnosis not present

## 2019-06-05 DIAGNOSIS — Z283 Underimmunization status: Secondary | ICD-10-CM | POA: Diagnosis not present

## 2019-06-05 DIAGNOSIS — Z0000 Encounter for general adult medical examination without abnormal findings: Secondary | ICD-10-CM | POA: Diagnosis not present

## 2019-06-05 DIAGNOSIS — E7800 Pure hypercholesterolemia, unspecified: Secondary | ICD-10-CM | POA: Diagnosis not present

## 2019-06-05 DIAGNOSIS — E559 Vitamin D deficiency, unspecified: Secondary | ICD-10-CM | POA: Diagnosis not present

## 2019-06-05 DIAGNOSIS — Z8249 Family history of ischemic heart disease and other diseases of the circulatory system: Secondary | ICD-10-CM | POA: Diagnosis not present

## 2019-06-05 DIAGNOSIS — F419 Anxiety disorder, unspecified: Secondary | ICD-10-CM | POA: Diagnosis not present

## 2019-06-20 DIAGNOSIS — Z566 Other physical and mental strain related to work: Secondary | ICD-10-CM | POA: Diagnosis not present

## 2019-06-20 DIAGNOSIS — F419 Anxiety disorder, unspecified: Secondary | ICD-10-CM | POA: Diagnosis not present

## 2021-07-14 ENCOUNTER — Other Ambulatory Visit (HOSPITAL_BASED_OUTPATIENT_CLINIC_OR_DEPARTMENT_OTHER): Payer: Self-pay | Admitting: Family Medicine

## 2021-07-14 DIAGNOSIS — E7800 Pure hypercholesterolemia, unspecified: Secondary | ICD-10-CM

## 2021-07-30 ENCOUNTER — Other Ambulatory Visit: Payer: Self-pay

## 2021-07-30 ENCOUNTER — Ambulatory Visit (HOSPITAL_BASED_OUTPATIENT_CLINIC_OR_DEPARTMENT_OTHER)
Admission: RE | Admit: 2021-07-30 | Discharge: 2021-07-30 | Disposition: A | Discharge status: Home / Self Care | Payer: Self-pay | Source: Ambulatory Visit | Attending: Family Medicine | Admitting: Family Medicine

## 2021-07-30 DIAGNOSIS — E7800 Pure hypercholesterolemia, unspecified: Secondary | ICD-10-CM | POA: Insufficient documentation

## 2022-08-30 ENCOUNTER — Other Ambulatory Visit: Payer: Self-pay

## 2022-08-30 ENCOUNTER — Ambulatory Visit (AMBULATORY_SURGERY_CENTER): Payer: Self-pay

## 2022-08-30 VITALS — Ht 63.0 in | Wt 125.0 lb

## 2022-08-30 DIAGNOSIS — Z1211 Encounter for screening for malignant neoplasm of colon: Secondary | ICD-10-CM

## 2022-08-30 MED ORDER — NA SULFATE-K SULFATE-MG SULF 17.5-3.13-1.6 GM/177ML PO SOLN: 1.0000 | Freq: Once | ORAL | 0 refills | Status: AC

## 2022-08-30 NOTE — Progress Notes (Deleted)
.  extr °

## 2022-08-30 NOTE — Progress Notes (Signed)
Denies allergies to eggs or soy products. Denies complication of anesthesia or sedation. Denies use of weight loss medication. Denies use of O2.   Emmi instructions given for colonoscopy. 

## 2022-09-07 ENCOUNTER — Encounter: Payer: Self-pay | Admitting: Internal Medicine

## 2022-09-12 IMAGING — CT CT CARDIAC CORONARY ARTERY CALCIUM SCORE
3 series · 14 of 20 positions shown, 16 images · non-contrast
Comparison: None.

Addendum:
CLINICAL DATA: Cardiovascular Disease Risk stratification

EXAM:
Coronary Calcium Score
TECHNIQUE: A gated, non-contrast computed tomography scan of the heart was
performed using 3mm slice thickness. Axial images were analyzed on a
dedicated workstation. Calcium scoring of the coronary arteries was
performed using the Agatston method.

[Series 2: ax lung · axial · 0.68mm/px · z∈[+998,+1088]mm · 5 of 69 slices shown]
[im 12/69  lung]
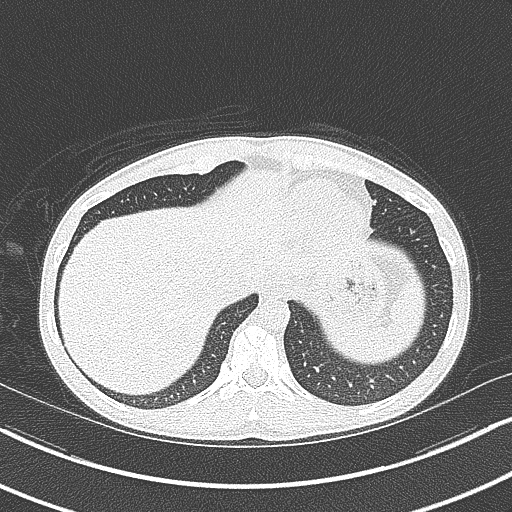
[im 23/69  lung]
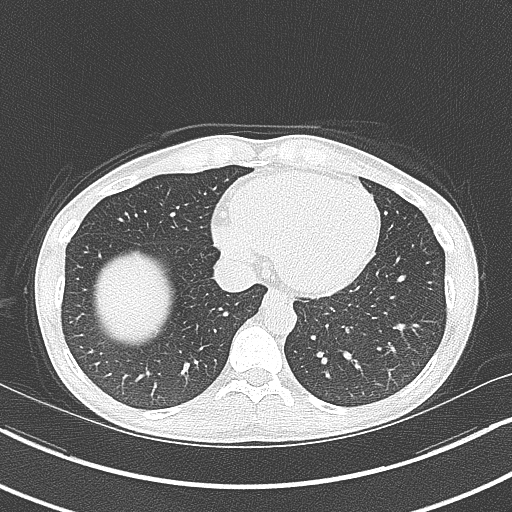
[im 35/69  lung]
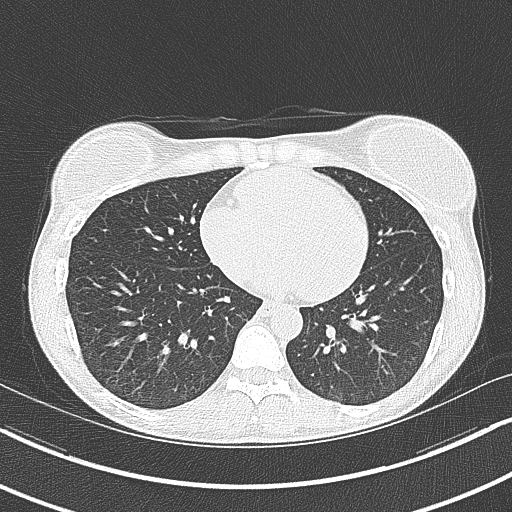
[im 46/69  lung]
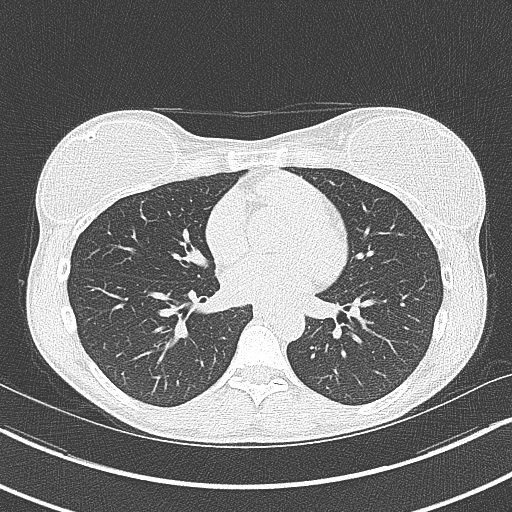
[im 57/69  lung]
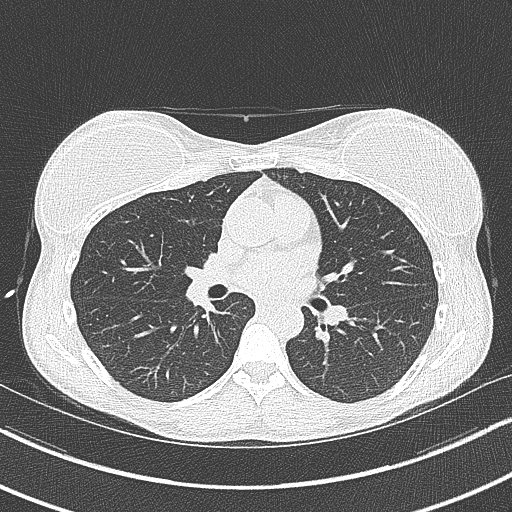

[Series 3: cascseq 3.0 sa36 70% (id) · axial · 0.39mm/px · z∈[+1009,+1075]mm · 3 of 46 slices shown]
[im 12/46  vessel]
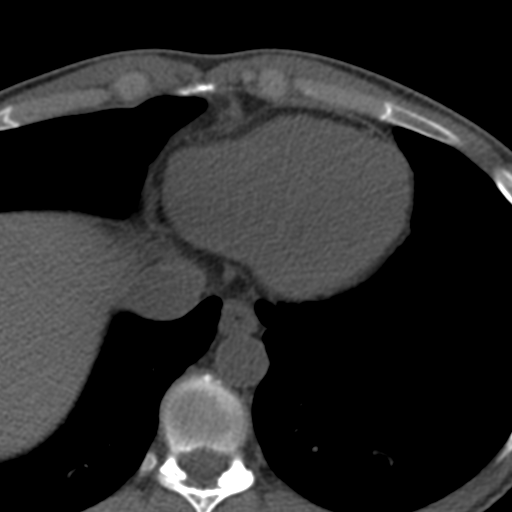
[im 23/46  vessel]
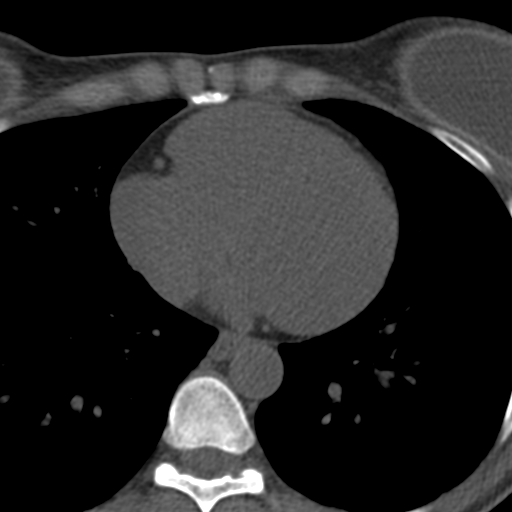
[im 34/46  vessel]
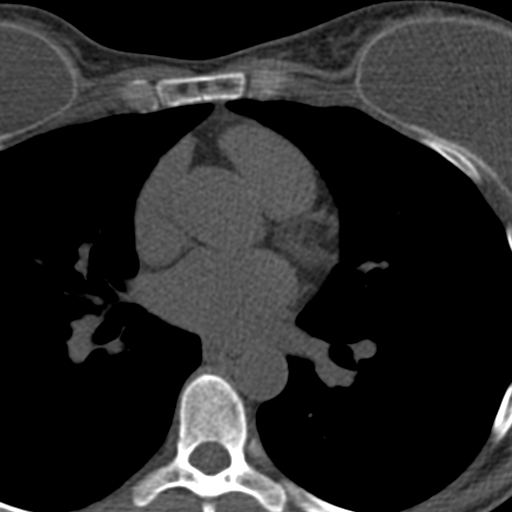

[Series 4: ax st · axial · 0.68mm/px · z∈[+994,+1092]mm · 6 of 69 slices shown, 8 images]
[im 10/69  vessel]
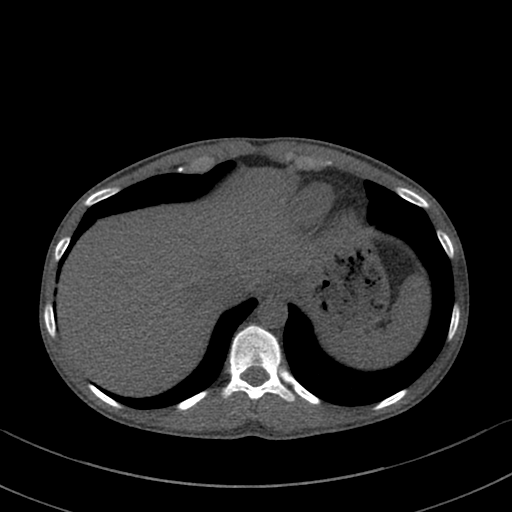
[im 10/69  lung]
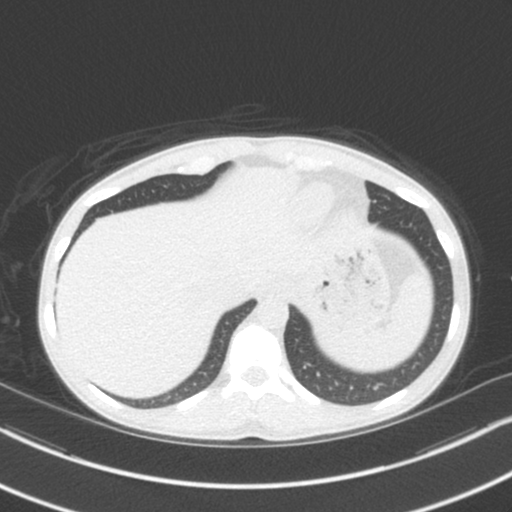
[im 20/69  vessel]
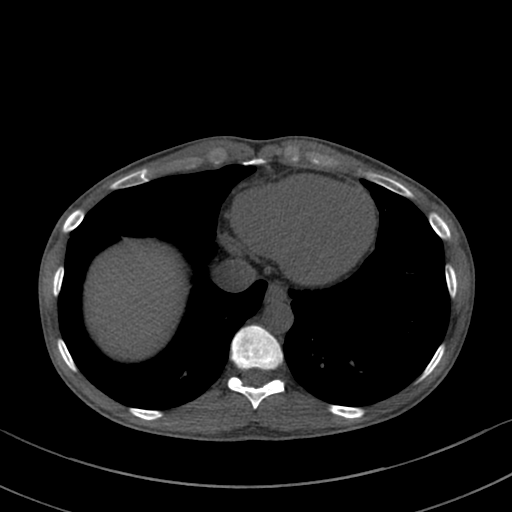
[im 30/69  vessel]
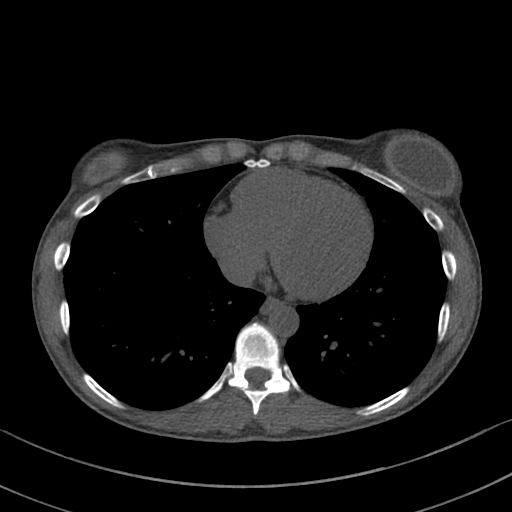
[im 39/69  vessel]
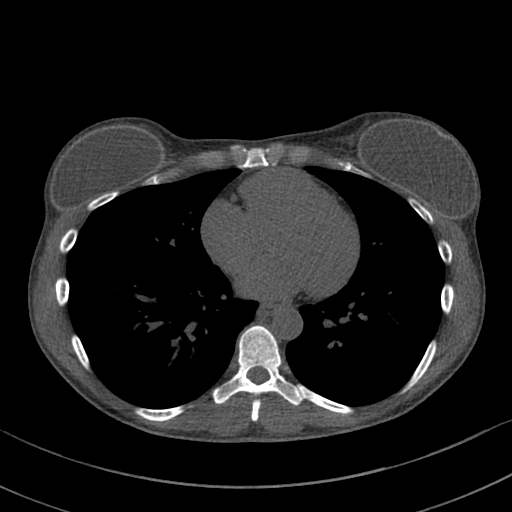
[im 49/69  vessel]
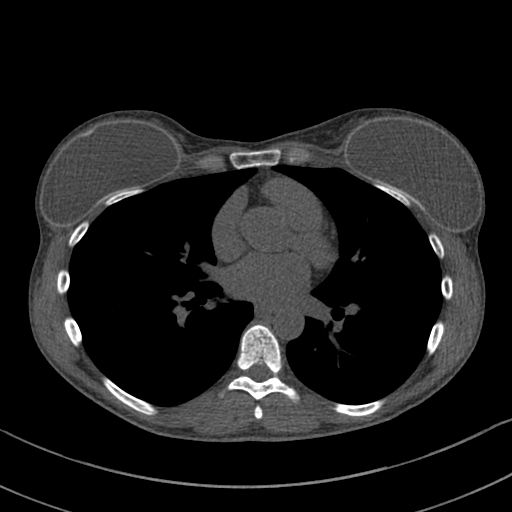
[im 49/69  lung]
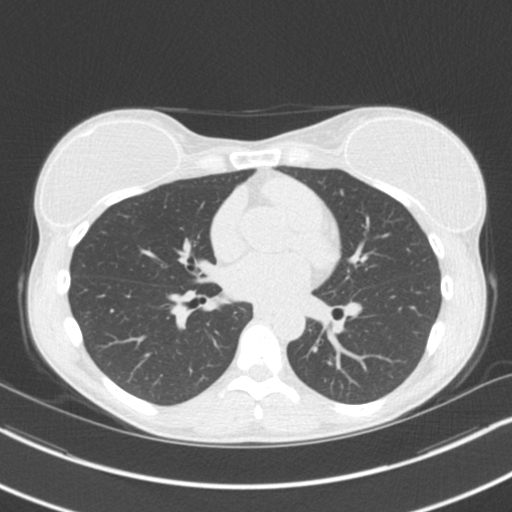
[im 59/69  vessel]
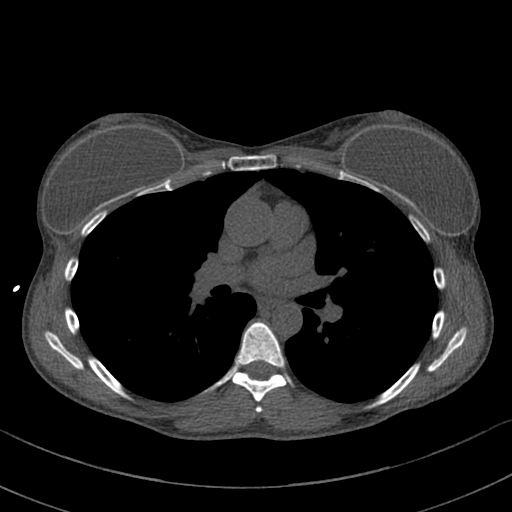

[14 of 20 positions shown; findings below may reference images not displayed]

FINDINGS: Coronary arteries: Normal origins.

Coronary Calcium Score:

Left main: 0

Left anterior descending artery: 0

Left circumflex artery: 0

Right coronary artery: 0

Total: 0

Percentile: 0

Pericardium: Normal.

Ascending Aorta: Normal caliber.

Non-cardiac: See separate report from [REDACTED].
IMPRESSION: Coronary calcium score of 0. This is a low risk study.



If CAC=0, it is reasonable to withhold statin therapy and reassess
in 5 to 10 years, as long as higher risk conditions are absent
(diabetes mellitus, family history of premature CHD in first degree
relatives (males <55 years; females <65 years), cigarette smoking,
or LDL >=190 mg/dL).

If CAC is 1 to 99, it is reasonable to initiate statin therapy for
patients >=55 years of age.

If CAC is >=100 or >=75th percentile, it is reasonable to initiate
statin therapy at any age.

Cardiology referral should be considered for patients with CAC
scores >=400 or >=75th percentile.

*7049 AHA/ACC/AACVPR/AAPA/ABC/AKOSTA/ADRIAN SCOTT/MARIT/Keheneal Francis/FKR/MIYA/PAULUS N
Guideline on the Management of Blood Cholesterol: A Report of the
American College of Cardiology/American Heart Association Task Force
on Clinical Practice Guidelines. J Am Coll Cardiol.
6763;73(24):3682-3866.

EXAM:
OVER-READ INTERPRETATION  CT CHEST

The following report is an over-read performed by radiologist Dr.
over-read does not include interpretation of cardiac or coronary
anatomy or pathology. The coronary calcium score interpretation by
the cardiologist is attached.
FINDINGS: No significant noncardiac vascular findings. Visualized mediastinum
and hilar regions demonstrate no lymphadenopathy or masses.
Visualized lungs show no evidence of pulmonary edema, consolidation,
pneumothorax, nodule or pleural fluid. Visualized upper abdomen and
bony structures are unremarkable. Bilateral breast implants appear
unremarkable.
IMPRESSION: No significant incidental findings.

*** End of Addendum ***
FINDINGS: Coronary arteries: Normal origins.

Coronary Calcium Score:

Left main: 0

Left anterior descending artery: 0

Left circumflex artery: 0

Right coronary artery: 0

Total: 0

Percentile: 0

Pericardium: Normal.

Ascending Aorta: Normal caliber.

Non-cardiac: See separate report from [REDACTED].
IMPRESSION: Coronary calcium score of 0. This is a low risk study.



If CAC=0, it is reasonable to withhold statin therapy and reassess
in 5 to 10 years, as long as higher risk conditions are absent
(diabetes mellitus, family history of premature CHD in first degree
relatives (males <55 years; females <65 years), cigarette smoking,
or LDL >=190 mg/dL).

If CAC is 1 to 99, it is reasonable to initiate statin therapy for
patients >=55 years of age.

If CAC is >=100 or >=75th percentile, it is reasonable to initiate
statin therapy at any age.

Cardiology referral should be considered for patients with CAC
scores >=400 or >=75th percentile.

*7049 AHA/ACC/AACVPR/AAPA/ABC/AKOSTA/ADRIAN SCOTT/MARIT/Keheneal Francis/FKR/MIYA/PAULUS N
Guideline on the Management of Blood Cholesterol: A Report of the
American College of Cardiology/American Heart Association Task Force
on Clinical Practice Guidelines. J Am Coll Cardiol.
6763;73(24):3682-3866.

## 2022-09-16 ENCOUNTER — Encounter: Payer: Self-pay | Admitting: Internal Medicine

## 2022-09-16 ENCOUNTER — Ambulatory Visit: Payer: 59 | Admitting: Internal Medicine

## 2022-09-16 VITALS — BP 109/71 | HR 56 | Temp 98.0°F | Resp 11 | Ht 63.0 in | Wt 125.0 lb

## 2022-09-16 DIAGNOSIS — Z1211 Encounter for screening for malignant neoplasm of colon: Secondary | ICD-10-CM | POA: Diagnosis present

## 2022-09-16 MED ORDER — SODIUM CHLORIDE 0.9 % IV SOLN: 500.0000 mL | Freq: Once | INTRAVENOUS | Status: DC

## 2022-09-16 NOTE — Progress Notes (Signed)
Report to pacu rn. Vss. Care resumed by rn. 

## 2022-09-16 NOTE — Progress Notes (Signed)
[facility] History and Physical   Primary Care Physician:  Donald Prose, MD   Reason for Procedure:   CRCA screening  Plan:    colonoscopy     HPI: Roberta Larson is a 47 y.o. female here for initial screening colonoscopy   Past Medical History:  Diagnosis Date   Anxiety    Endometrial polyp    GERD (gastroesophageal reflux disease)    Hyperlipidemia    OA (osteoarthritis)    RIGHT KNEE,  LEFT HIP   Wears contact lenses     Past Surgical History:  Procedure Laterality Date   ABDOMINOPLASTY  2014   BREAST ENHANCEMENT SURGERY  1997   CESAREAN SECTION  2008   DILATATION & CURETTAGE/HYSTEROSCOPY WITH MYOSURE N/A 10/07/2016   Procedure: DILATATION & CURETTAGE/HYSTEROSCOPY;  Surgeon: Linda Hedges, DO;  Location: [place];  Service: Gynecology;  Laterality: N/A;   EXCISION SUBCUTANEOUS CYST  2012   EXPLORATORY LAPARTOMY W/ RIGHT SALPINGOOPHORECTOMY  2010   HIP ARTHROSCOPY W/ LABRAL REPAIR Left 09/2015    Prior to Admission medications   Medication Sig Start Date End Date Taking? Authorizing Provider  norgestimate-ethinyl estradiol (ORTHO-CYCLEN) 0.25-35 MG-MCG tablet Take 1 tablet by mouth daily.   Yes [provider]  OVER THE COUNTER MEDICATION Melatonin 10 mg before bedtime.   Yes [provider]  sertraline (ZOLOFT) 50 MG tablet Take 50 mg by mouth daily.   Yes [provider]    Current Outpatient Medications  Medication Sig Dispense Refill   norgestimate-ethinyl estradiol (ORTHO-CYCLEN) 0.25-35 MG-MCG tablet Take 1 tablet by mouth daily.     OVER THE COUNTER MEDICATION Melatonin 10 mg before bedtime.     sertraline (ZOLOFT) 50 MG tablet Take 50 mg by mouth daily.     Current Facility-Administered Medications  Medication Dose Route Frequency Provider Last Rate Last Admin   0.9 %  sodium chloride infusion  500 mL Intravenous Once Gatha Mayer, MD        Allergies as of 09/16/2022   (No Known Allergies)     Family History  Problem Relation Age of Onset   Colon cancer Neg Hx    Esophageal cancer Neg Hx    Rectal cancer Neg Hx    Stomach cancer Neg Hx     Social History   Socioeconomic History   Marital status: Married    Spouse name: Not on file   Number of children: Not on file   Years of education: Not on file   Highest education level: Not on file  Occupational History   Not on file  Tobacco Use   Smoking status: Never   Smokeless tobacco: Never  Vaping Use   Vaping Use: Never used  Substance and Sexual Activity   Alcohol use: Yes    Comment: OCCASIONAL   Drug use: No   Sexual activity: Yes    Birth control/protection: Other-see comments    Comment: husband- vasectomy  Other Topics Concern   Not on file  Social History Narrative   Not on file   Social Determinants of Health   Financial Resource Strain: Not on file  Food Insecurity: Not on file  Transportation Needs: Not on file  Physical Activity: Not on file  Stress: Not on file  Social Connections: Not on file  Intimate Partner Violence: Not on file    Review of Systems:  All other review of systems negative except as mentioned in the HPI.  Physical Exam: Vital signs BP 120/87  Pulse 63   Temp 98 F (36.7 C) (Temporal)   Ht 5' 3"$  (1.6 m)   Wt 125 lb (56.7 kg)   LMP 08/16/2022   SpO2 97%   BMI 22.14 kg/m   General:   Alert,  Well-developed, well-nourished, pleasant and cooperative in NAD Lungs:  Clear throughout to auscultation.   Heart:  Regular rate and rhythm; no murmurs, clicks, rubs,  or gallops. Abdomen:  Soft, nontender and nondistended. Normal bowel sounds.   Neuro/Psych:  Alert and cooperative. Normal mood and affect. A and O x 3   @Starleen Trussell$  Simonne Maffucci, MD, [facility] [phone] (pager) 09/16/2022 7:55 AM@

## 2022-09-16 NOTE — Progress Notes (Signed)
Pt's states no medical or surgical changes since previsit or office visit. 

## 2022-09-16 NOTE — Op Note (Signed)
[facility] Patient Name: Roberta Larson Procedure Date: 09/16/2022 7:56 AM MRN: [id number] Endoscopist: Gatha Mayer , MD, [phone] Age: 47 Referring MD:  Date of Birth: [birth year] Gender: Female Account #: [id number] Procedure:                Colonoscopy Indications:              Screening for colorectal malignant neoplasm, This                            is the patient's first colonoscopy Medicines:                Monitored Anesthesia Care Procedure:                Pre-Anesthesia Assessment:                           - Prior to the procedure, a History and Physical                            was performed, and patient medications and                            allergies were reviewed. The patient's tolerance of                            previous anesthesia was also reviewed. The risks                            and benefits of the procedure and the sedation                            options and risks were discussed with the patient.                            All questions were answered, and informed consent                            was obtained. Prior Anticoagulants: The patient has                            taken no anticoagulant or antiplatelet agents. ASA                            Grade Assessment: II - A patient with mild systemic                            disease. After reviewing the risks and benefits,                            the patient was deemed in satisfactory condition to                            undergo the procedure.  After obtaining informed consent, the colonoscope                            was passed under direct vision. Throughout the                            procedure, the patient's blood pressure, pulse, and                            oxygen saturations were monitored continuously. The                            Olympus PCF-[id number]) Colonoscope was                            introduced through the  anus and advanced to the the                            cecum, identified by appendiceal orifice and                            ileocecal valve. The colonoscopy was performed                            without difficulty. The patient tolerated the                            procedure well. The quality of the bowel                            preparation was adequate. The bowel preparation                            used was SUPREP via split dose instruction. The                            ileocecal valve, appendiceal orifice, and rectum                            were photographed. Scope In: 8:03:13 AM Scope Out: 8:33:53 AM Scope Withdrawal Time: 0 hours 23 minutes 45 seconds  Total Procedure Duration: 0 hours 30 minutes 40 seconds  Findings:                 The perianal and digital rectal examinations were                            normal.                           The entire examined colon appeared normal on direct                            and retroflexion views. Complications:            No immediate complications. Estimated Blood  Loss:     Estimated blood loss: none. Impression:               - The entire examined colon is normal on direct and                            retroflexion views.                           - No specimens collected. Recommendation:           - Patient has a contact number available for                            emergencies. The signs and symptoms of potential                            delayed complications were discussed with the                            patient. Return to normal activities tomorrow.                            Written discharge instructions were provided to the                            patient.                           - Resume previous diet.                           - Continue present medications.                           - Repeat colonoscopy in 10 years for screening                            purposes. Gatha Mayer,  MD 09/16/2022 8:39:32 AM This report has been signed electronically.

## 2022-09-16 NOTE — Patient Instructions (Addendum)
Normal colonoscopy - no polyps or cancer seen.  Next routine colonoscopy or other screening test in 10 years - 2034.   Constipation help - kiwi, prunes, psyllium and MiraLax if necessary.  Dietary he\lp:  Try to get 30 different plants in your meals each week.  This is easier than it sounds since it includes vegetables, fruits, seeds and nuts as well as spices.  Also try to have a variety of colors in your plants for the week. The purpose is to feed and support a diverse gut microbiome-the bacteria that live inside of you.  Probiotics are important for health, they are best gotten through foods.  Pills are not proven to be effective in some research is suggesting they may actually cause problems.  Here are some examples of probiotic foods (also known as fermented foods):  Yogurt-high protein whole fat yogurt is best I believe, [place] yogurt is an example.  Add your own fruit because sugar is almost always added to yogurt with fruit in it found at the store.  Kefir-kind of like liquid yogurt  Miso-an Asian fermented bean paste  Sauerkraut  Kimchi-Asian fermented cabbage  Apple cider vinegar-use the liquid not Gummies or pills.  Some people can tolerate a spoonful of this alone but many fine adding it to water reduces the acidity.  You can work up to higher doses.  If taking it on deluded rinse your mouth or drink water afterwards to reduce risk of dental damage.  A tablespoon a day is a good dose.  You could take more also.  Some people also find this helpful with reflux.   I appreciate the opportunity to care for you. Gatha Mayer, MD, FACG   YOU HAD AN ENDOSCOPIC PROCEDURE TODAY AT [facility]:   Refer to the procedure report that was given to you for any specific questions about what was found during the examination.  If the procedure report does not answer your questions, please call your gastroenterologist to clarify.  If you requested that your care partner not  be given the details of your procedure findings, then the procedure report has been included in a sealed envelope for you to review at your convenience later.  YOU SHOULD EXPECT: Some feelings of bloating in the abdomen. Passage of more gas than usual.  Walking can help get rid of the air that was put into your GI tract during the procedure and reduce the bloating. If you had a lower endoscopy (such as a colonoscopy or flexible sigmoidoscopy) you may notice spotting of blood in your stool or on the toilet paper. If you underwent a bowel prep for your procedure, you may not have a normal bowel movement for a few days.  Please Note:  You might notice some irritation and congestion in your nose or some drainage.  This is from the oxygen used during your procedure.  There is no need for concern and it should clear up in a day or so.  SYMPTOMS TO REPORT IMMEDIATELY:  Following lower endoscopy (colonoscopy or flexible sigmoidoscopy):  Excessive amounts of blood in the stool  Significant tenderness or worsening of abdominal pains  Swelling of the abdomen that is new, acute  Fever of 100F or higher   For urgent or emergent issues, a gastroenterologist can be reached at any hour by calling [phone]. Do not use MyChart messaging for urgent concerns.    DIET:  We do recommend a small meal at first, but then you  may proceed to your regular diet.  Drink plenty of fluids but you should avoid alcoholic beverages for 24 hours.  ACTIVITY:  You should plan to take it easy for the rest of today and you should NOT DRIVE or use heavy machinery until tomorrow (because of the sedation medicines used during the test).    FOLLOW UP: Our staff will call the number listed on your records the next business day following your procedure.  We will call around 7:15- 8:00 am to check on you and address any questions or concerns that you may have regarding the information given to you following your procedure. If we do  not reach you, we will leave a message.      SIGNATURES/CONFIDENTIALITY: You and/or your care partner have signed paperwork which will be entered into your electronic medical record.  These signatures attest to the fact that that the information above on your After Visit Summary has been reviewed and is understood.  Full responsibility of the confidentiality of this discharge information lies with you and/or your care-partner.

## 2022-09-17 ENCOUNTER — Telehealth: Payer: Self-pay | Admitting: *Deleted

## 2022-09-17 NOTE — Telephone Encounter (Signed)
  Follow up Call-     09/16/2022    7:18 AM  Call back number  Post procedure Call Back phone  # [phone]  Permission to leave phone message Yes     Post procedure follow up phone call. No answer at number given.  Left message on voicemail.

## 2024-05-18 ENCOUNTER — Emergency Department (HOSPITAL_BASED_OUTPATIENT_CLINIC_OR_DEPARTMENT_OTHER)
Admission: EM | Admit: 2024-05-18 | Discharge: 2024-05-18 | Disposition: A | Discharge status: Home / Self Care | Attending: Emergency Medicine | Admitting: Emergency Medicine

## 2024-05-18 ENCOUNTER — Encounter (HOSPITAL_BASED_OUTPATIENT_CLINIC_OR_DEPARTMENT_OTHER): Payer: Self-pay | Admitting: *Deleted

## 2024-05-18 ENCOUNTER — Emergency Department (HOSPITAL_BASED_OUTPATIENT_CLINIC_OR_DEPARTMENT_OTHER): Admitting: Radiology

## 2024-05-18 ENCOUNTER — Other Ambulatory Visit: Payer: Self-pay

## 2024-05-18 DIAGNOSIS — R059 Cough, unspecified: Secondary | ICD-10-CM | POA: Insufficient documentation

## 2024-05-18 DIAGNOSIS — R0789 Other chest pain: Secondary | ICD-10-CM | POA: Diagnosis present

## 2024-05-18 DIAGNOSIS — J189 Pneumonia, unspecified organism: Secondary | ICD-10-CM

## 2024-05-18 DIAGNOSIS — J181 Lobar pneumonia, unspecified organism: Secondary | ICD-10-CM | POA: Diagnosis not present

## 2024-05-18 DIAGNOSIS — J9801 Acute bronchospasm: Secondary | ICD-10-CM | POA: Diagnosis not present

## 2024-05-18 LAB — RESP PANEL BY RT-PCR (RSV, FLU A&B, COVID)  RVPGX2
Influenza A by PCR: NEGATIVE
Influenza B by PCR: NEGATIVE
Resp Syncytial Virus by PCR: NEGATIVE
SARS Coronavirus 2 by RT PCR: NEGATIVE

## 2024-05-18 MED ORDER — KETOROLAC TROMETHAMINE 30 MG/ML IJ SOLN
30.0000 mg | Freq: Once | INTRAMUSCULAR | Status: AC
  Administered 2024-05-18: 30 mg via INTRAMUSCULAR
  Filled 2024-05-18: qty 1

## 2024-05-18 MED ORDER — AZITHROMYCIN 250 MG PO TABS: 250.0000 mg | ORAL_TABLET | Freq: Every day | ORAL | 0 refills | Status: AC

## 2024-05-18 MED ORDER — CYCLOBENZAPRINE HCL 5 MG PO TABS: 5.0000 mg | ORAL_TABLET | Freq: Once | ORAL | Status: DC

## 2024-05-18 MED ORDER — IBUPROFEN 600 MG PO TABS: 600.0000 mg | ORAL_TABLET | Freq: Four times a day (QID) | ORAL | 0 refills | Status: AC | PRN

## 2024-05-18 MED ORDER — AMOXICILLIN 500 MG PO CAPS: 1000.0000 mg | ORAL_CAPSULE | Freq: Three times a day (TID) | ORAL | 0 refills | Status: AC

## 2024-05-18 MED ORDER — DEXAMETHASONE 4 MG PO TABS
10.0000 mg | ORAL_TABLET | Freq: Once | ORAL | Status: AC
  Administered 2024-05-18: 10 mg via ORAL
  Filled 2024-05-18: qty 3

## 2024-05-18 MED ORDER — ALBUTEROL SULFATE HFA 108 (90 BASE) MCG/ACT IN AERS
2.0000 | INHALATION_SPRAY | Freq: Once | RESPIRATORY_TRACT | Status: AC
  Administered 2024-05-18: 2 via RESPIRATORY_TRACT
  Filled 2024-05-18: qty 6.7

## 2024-05-18 MED ORDER — PROMETHAZINE-DM 6.25-15 MG/5ML PO SYRP: 5.0000 mL | ORAL_SOLUTION | Freq: Four times a day (QID) | ORAL | 0 refills | Status: AC | PRN

## 2024-05-18 MED ORDER — HYDROCODONE-ACETAMINOPHEN 7.5-325 MG/15ML PO SOLN
10.0000 mL | Freq: Once | ORAL | Status: AC
  Administered 2024-05-18: 10 mL via ORAL
  Filled 2024-05-18: qty 15

## 2024-05-18 NOTE — ED Provider Notes (Signed)
 [place] [facility] AT [facility] Provider Note   CSN: [id number] Arrival date & time: 05/18/24  0459     Patient presents with: Cough   Roberta Larson is a 48 y.o. female.   HPI     This is a 48 year old female who presents with cough and chest pain.  Patient states that she has had a cough for over 4 weeks.  Initially she took a Z-Pak that did not help much.  She has not had any fevers during this time.  She subsequently took a 5-day burst of steroids after week 2.  She tested COVID negative during this time.  She has taken albuterol for wheezing intermittently with some relief.  Over the last 2 to 3 days she has developed worsening right lower chest wall and posterior back pain.  It is worse with deep breathing and cough.  She took ibuprofen  with minimal relief.  She states she is unable to take a deep breath or cough without significant discomfort.  Cough seems to have worsened because of pain.  No lower extremity edema.  No history of blood clots.  Prior to Admission medications   Medication Sig Start Date End Date Taking? Authorizing Provider  amoxicillin (AMOXIL) 500 MG capsule Take 2 capsules (1,000 mg total) by mouth 3 (three) times daily. 05/18/24  Yes Demarious Kapur, Charmaine FALCON, MD  azithromycin (ZITHROMAX) 250 MG tablet Take 1 tablet (250 mg total) by mouth daily. Take first 2 tablets together, then 1 every day until finished. 05/18/24  Yes Blimie Vaness, Charmaine FALCON, MD  ibuprofen  (ADVIL ) 600 MG tablet Take 1 tablet (600 mg total) by mouth every 6 (six) hours as needed. 05/18/24  Yes Naphtali Riede, Charmaine FALCON, MD  promethazine-dextromethorphan (PROMETHAZINE-DM) 6.25-15 MG/5ML syrup Take 5 mLs by mouth 4 (four) times daily as needed for cough. 05/18/24  Yes Genevive Printup, Charmaine FALCON, MD  norgestimate-ethinyl estradiol (ORTHO-CYCLEN) 0.25-35 MG-MCG tablet Take 1 tablet by mouth daily.    [provider]  OVER THE COUNTER MEDICATION Melatonin 10 mg before bedtime.    [provider]  sertraline (ZOLOFT) 50 MG tablet Take 100 mg by mouth daily.    [provider]    Allergies: Patient has no known allergies.    Review of Systems  Constitutional:  Negative for fever.  Respiratory:  Positive for cough and wheezing. Negative for shortness of breath.   Cardiovascular:  Positive for chest pain. Negative for leg swelling.  All other systems reviewed and are negative.   Updated Vital Signs BP (!) 146/91 (BP Location: Right Arm)   Pulse 73   Temp 98 F (36.7 C)   Resp 20   Ht 1.6 m (5' 3)   Wt 56.7 kg   LMP 04/23/2024 (Approximate)   SpO2 95%   BMI 22.14 kg/m   Physical Exam Vitals and nursing note reviewed.  Constitutional:      Appearance: She is well-developed. She is not ill-appearing or toxic-appearing.     Comments: Uncomfortable appearing but nontoxic  HENT:     Head: Normocephalic and atraumatic.     Mouth/Throat:     Mouth: Mucous membranes are moist.  Eyes:     Pupils: Pupils are equal, round, and reactive to light.  Cardiovascular:     Rate and Rhythm: Normal rate and regular rhythm.     Heart sounds: Normal heart sounds.  Pulmonary:     Effort: Pulmonary effort is normal. No respiratory distress.     Breath sounds: No wheezing.  Comments: Diminished breath sounds bilateral lower lobes, no active wheezing, active coughing with splinting noted Abdominal:     Palpations: Abdomen is soft.     Tenderness: There is no abdominal tenderness.  Musculoskeletal:        General: No tenderness.     Right lower leg: No edema.     Left lower leg: No edema.  Skin:    General: Skin is warm and dry.  Neurological:     Mental Status: She is alert and oriented to person, place, and time.  Psychiatric:        Mood and Affect: Mood normal.     (all labs ordered are listed, but only abnormal results are displayed) Labs Reviewed  RESP PANEL BY RT-PCR (RSV, FLU A&B, COVID)  [id number]    EKG: None  Radiology: DG Chest 2  View Result Date: 05/18/2024 EXAM: 2 VIEW(S) XRAY OF THE CHEST 05/18/2024 05:23:44 AM COMPARISON: None available. CLINICAL HISTORY: 4 weeks of cough. C/o cough times 4 weeks. Has been treated with an antibiotic ( z-pak) and steroids week 2. Tested negative initially for covid. States she has taken albuterol for wheezing over the last 4 weeks at present. Denies fevers. States pain to right ; side/posterior back with cough and breathing. Has taking ibuprofen  in the past for pain. FINDINGS: LUNGS AND PLEURA: Patchy airspace disease in right lower lung. No pulmonary edema. No pleural effusion. No pneumothorax. HEART AND MEDIASTINUM: No acute abnormality of the cardiac and mediastinal silhouettes. BONES AND SOFT TISSUES: No acute osseous abnormality. IMPRESSION: 1. Patchy airspace disease in the right lower lung. Electronically signed by: Evalene Coho MD 05/18/2024 06:04 AM EDT RP Workstation: [id number]     Procedures   Medications Ordered in the ED  dexamethasone  (DECADRON ) tablet 10 mg (has no administration in time range)  albuterol (VENTOLIN HFA) 108 (90 Base) MCG/ACT inhaler 2 puff (has no administration in time range)  ketorolac  (TORADOL ) 30 MG/ML injection 30 mg (30 mg Intramuscular Given 05/18/24 0533)  HYDROcodone-acetaminophen  (HYCET) 7.5-325 mg/15 ml solution 10 mL (10 mLs Oral Given 05/18/24 0531)                                    Medical Decision Making Amount and/or Complexity of Data Reviewed Radiology: ordered.  Risk Prescription drug management.   This patient presents to the ED for concern of cough, chest pain, this involves an extensive number of treatment options, and is a complaint that carries with it a high risk of complications and morbidity.  I considered the following differential and admission for this acute, potentially life threatening condition.  The differential diagnosis includes viral infection, bronchitis, bronchospasm, chest wall pain, pneumonia, less  likely ACS, or PE  MDM:    This is a 48 year old female who presents with ongoing cough.  Has had worsening chest discomfort associated with cough over the last couple days.  She is nontoxic.  O2 sats 95%.  She does appear uncomfortable.  She has diminished breath sounds in the bilateral lower lobes.  Chest x-ray is concerning for airspace disease in the right lower lobe which would correspond to where her chest discomfort is.  Viral testing is negative.  Patient was given Toradol  and Hycet for her pain and cough.  She took a Z-Pak early in the course of her illness; however do not feel she technically failed this.  Recommend Z-Pak and amoxicillin.  Will send  a short course of cough medicine and patient was given an albuterol inhaler with a spacer for ongoing bronchospasm.  She was also given a dose of steroid.  (Labs, imaging, consults)  Labs: I Ordered, and personally interpreted labs.  The pertinent results include: COVID, flu negative  Imaging Studies ordered: I ordered imaging studies including chest x-ray I independently visualized and interpreted imaging. I agree with the radiologist interpretation  Additional history obtained from chart review.  External records from outside source obtained and reviewed including prior evaluations  Cardiac Monitoring: The patient was maintained on a cardiac monitor.  If on the cardiac monitor, I personally viewed and interpreted the cardiac monitored which showed an underlying rhythm of: Sinus  Reevaluation: After the interventions noted above, I reevaluated the patient and found that they have :improved  Social Determinants of Health:  lives independently  Disposition: Discharge  Co morbidities that complicate the patient evaluation  Past Medical History:  Diagnosis Date   Anxiety    Endometrial polyp    GERD (gastroesophageal reflux disease)    Hyperlipidemia    OA (osteoarthritis)    RIGHT KNEE,  LEFT HIP   Wears contact lenses       Medicines Meds ordered this encounter  Medications   ketorolac  (TORADOL ) 30 MG/ML injection 30 mg   DISCONTD: cyclobenzaprine (FLEXERIL) tablet 5 mg   HYDROcodone-acetaminophen  (HYCET) 7.5-325 mg/15 ml solution 10 mL    Refill:  0   promethazine-dextromethorphan (PROMETHAZINE-DM) 6.25-15 MG/5ML syrup    Sig: Take 5 mLs by mouth 4 (four) times daily as needed for cough.    Dispense:  118 mL    Refill:  0   amoxicillin (AMOXIL) 500 MG capsule    Sig: Take 2 capsules (1,000 mg total) by mouth 3 (three) times daily.    Dispense:  60 capsule    Refill:  0   azithromycin (ZITHROMAX) 250 MG tablet    Sig: Take 1 tablet (250 mg total) by mouth daily. Take first 2 tablets together, then 1 every day until finished.    Dispense:  6 tablet    Refill:  0   ibuprofen  (ADVIL ) 600 MG tablet    Sig: Take 1 tablet (600 mg total) by mouth every 6 (six) hours as needed.    Dispense:  30 tablet    Refill:  0   dexamethasone  (DECADRON ) tablet 10 mg   albuterol (VENTOLIN HFA) 108 (90 Base) MCG/ACT inhaler 2 puff    I have reviewed the patients home medicines and have made adjustments as needed  Problem List / ED Course: Problem List Items Addressed This Visit   None Visit Diagnoses       Community acquired pneumonia of right lower lobe of lung    -  Primary   Relevant Medications   promethazine-dextromethorphan (PROMETHAZINE-DM) 6.25-15 MG/5ML syrup   amoxicillin (AMOXIL) 500 MG capsule   azithromycin (ZITHROMAX) 250 MG tablet   albuterol (VENTOLIN HFA) 108 (90 Base) MCG/ACT inhaler 2 puff (Start on 05/18/2024  6:30 AM)     Bronchospasm         Chest wall pain                    Final diagnoses:  Community acquired pneumonia of right lower lobe of lung  Bronchospasm  Chest wall pain    ED Discharge Orders          Ordered    promethazine-dextromethorphan (PROMETHAZINE-DM) 6.25-15 MG/5ML syrup  4 times daily  PRN        05/18/24 0616    amoxicillin (AMOXIL) 500 MG capsule  3  times daily        05/18/24 0616    azithromycin (ZITHROMAX) 250 MG tablet  Daily        05/18/24 0616    ibuprofen  (ADVIL ) 600 MG tablet  Every 6 hours PRN        05/18/24 0616               Bari Charmaine FALCON, MD 05/18/24 [phone]

## 2024-05-18 NOTE — Discharge Instructions (Signed)
 It was a pleasure caring for you today.  You were seen today for cough and chest pain.  Your chest x-ray shows likely a right lower lobe pneumonia.  You will be given amoxicillin and azithromycin.  Take ibuprofen  for chest wall pain.  You will also be given a cough syrup for ongoing cough.  Continue albuterol for any bronchospasm.  You were also given an additional dose of steroids here in the emergency department.  If you have any new or worsening symptoms including shortness of breath, you should be reevaluated.

## 2024-05-18 NOTE — ED Triage Notes (Addendum)
 C/o cough times 4 weeks. Has been treated with an antibiotic ( z-pak) and steroids week 2. Tested negative initially for covid. States she has taken albuterol for wheezing over the last 4 weeks at present. Denies fevers. States pain to right side/posterior back with cough and breathing. Has taking ibuprofen  in the past for pain.
